# Patient Record
Sex: Male | Born: 1965 | Race: Black or African American | Hispanic: No | Smoking: Current every day smoker
Health system: Southern US, Community
[De-identification: ages and names within clinical notes are randomized; demographics above are authoritative.]

## PROBLEM LIST (undated history)

## (undated) DIAGNOSIS — I10 Essential (primary) hypertension: Secondary | ICD-10-CM

## (undated) DIAGNOSIS — M1990 Unspecified osteoarthritis, unspecified site: Secondary | ICD-10-CM

## (undated) DIAGNOSIS — E119 Type 2 diabetes mellitus without complications: Secondary | ICD-10-CM

---

## 1999-01-15 ENCOUNTER — Encounter: Payer: Self-pay | Admitting: Emergency Medicine

## 1999-01-15 ENCOUNTER — Emergency Department (HOSPITAL_COMMUNITY): Admission: EM | Admit: 1999-01-15 | Discharge: 1999-01-15 | Payer: Self-pay | Admitting: Emergency Medicine

## 1999-01-17 ENCOUNTER — Emergency Department (HOSPITAL_COMMUNITY): Admission: EM | Admit: 1999-01-17 | Discharge: 1999-01-17 | Payer: Self-pay | Admitting: Emergency Medicine

## 2000-08-04 ENCOUNTER — Emergency Department (HOSPITAL_COMMUNITY): Admission: EM | Admit: 2000-08-04 | Discharge: 2000-08-04 | Payer: Self-pay | Admitting: Emergency Medicine

## 2008-03-12 ENCOUNTER — Emergency Department (HOSPITAL_COMMUNITY): Admission: EM | Admit: 2008-03-12 | Discharge: 2008-03-12 | Payer: Self-pay | Admitting: Emergency Medicine

## 2012-01-02 ENCOUNTER — Emergency Department (HOSPITAL_COMMUNITY): Payer: No Typology Code available for payment source

## 2012-01-02 ENCOUNTER — Encounter (HOSPITAL_COMMUNITY): Payer: Self-pay | Admitting: *Deleted

## 2012-01-02 ENCOUNTER — Emergency Department (HOSPITAL_COMMUNITY)
Admission: EM | Admit: 2012-01-02 | Discharge: 2012-01-03 | Disposition: A | Discharge status: Home / Self Care | Payer: No Typology Code available for payment source | Attending: Emergency Medicine | Admitting: Emergency Medicine

## 2012-01-02 DIAGNOSIS — IMO0002 Reserved for concepts with insufficient information to code with codable children: Secondary | ICD-10-CM | POA: Insufficient documentation

## 2012-01-02 DIAGNOSIS — S298XXA Other specified injuries of thorax, initial encounter: Secondary | ICD-10-CM | POA: Insufficient documentation

## 2012-01-02 DIAGNOSIS — S299XXA Unspecified injury of thorax, initial encounter: Secondary | ICD-10-CM

## 2012-01-02 DIAGNOSIS — R079 Chest pain, unspecified: Secondary | ICD-10-CM | POA: Insufficient documentation

## 2012-01-02 DIAGNOSIS — M25559 Pain in unspecified hip: Secondary | ICD-10-CM | POA: Insufficient documentation

## 2012-01-02 MED ORDER — HYDROCODONE-ACETAMINOPHEN 5-500 MG PO TABS: 1.0000 | ORAL_TABLET | Freq: Four times a day (QID) | ORAL | Status: AC | PRN

## 2012-01-02 MED ORDER — OXYCODONE-ACETAMINOPHEN 5-325 MG PO TABS
1.0000 | ORAL_TABLET | Freq: Once | ORAL | Status: AC
  Administered 2012-01-02: 1 via ORAL
  Filled 2012-01-02: qty 1
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
## 2012-01-02 NOTE — ED Provider Notes (Signed)
I saw and evaluated the patient, reviewed the resident's note and I agree with the findings and plan.  Dia Jefferys, MD 01/02/12 2346 
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
## 2012-01-02 NOTE — ED Notes (Signed)
While pt was attempting to dress self, he noted increasing pain to rgt hip with weight bearing; MD made aware of same; orders placed for films and pain meds
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
## 2012-01-05 ENCOUNTER — Emergency Department (INDEPENDENT_AMBULATORY_CARE_PROVIDER_SITE_OTHER)
Admission: EM | Admit: 2012-01-05 | Discharge: 2012-01-05 | Disposition: A | Discharge status: Home / Self Care | Payer: Self-pay | Source: Home / Self Care | Attending: Family Medicine | Admitting: Family Medicine

## 2012-01-05 ENCOUNTER — Encounter (HOSPITAL_COMMUNITY): Payer: Self-pay | Admitting: Emergency Medicine

## 2012-01-05 DIAGNOSIS — S39012D Strain of muscle, fascia and tendon of lower back, subsequent encounter: Secondary | ICD-10-CM

## 2012-01-05 DIAGNOSIS — IMO0002 Reserved for concepts with insufficient information to code with codable children: Secondary | ICD-10-CM

## 2012-01-05 MED ORDER — CHLORZOXAZONE 500 MG PO TABS: 500.0000 mg | ORAL_TABLET | Freq: Four times a day (QID) | ORAL | Status: AC | PRN

## 2012-01-05 MED ORDER — IBUPROFEN 800 MG PO TABS: 800.0000 mg | ORAL_TABLET | Freq: Three times a day (TID) | ORAL | Status: AC
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
## 2012-01-05 NOTE — Discharge Instructions (Signed)
Heat and medicine as needed, activity as tolerated, return if further problems.
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
## 2014-09-18 ENCOUNTER — Emergency Department (INDEPENDENT_AMBULATORY_CARE_PROVIDER_SITE_OTHER): Payer: BLUE CROSS/BLUE SHIELD

## 2014-09-18 ENCOUNTER — Emergency Department (HOSPITAL_COMMUNITY)
Admission: EM | Admit: 2014-09-18 | Discharge: 2014-09-18 | Disposition: A | Discharge status: Home / Self Care | Payer: BLUE CROSS/BLUE SHIELD | Source: Home / Self Care | Attending: Family Medicine | Admitting: Family Medicine

## 2014-09-18 ENCOUNTER — Encounter (HOSPITAL_COMMUNITY): Payer: Self-pay

## 2014-09-18 DIAGNOSIS — M5137 Other intervertebral disc degeneration, lumbosacral region: Secondary | ICD-10-CM | POA: Diagnosis not present

## 2014-09-18 MED ORDER — DICLOFENAC POTASSIUM 50 MG PO TABS: 50.0000 mg | ORAL_TABLET | Freq: Three times a day (TID) | ORAL | Status: DC

## 2014-09-18 MED ORDER — METHYLPREDNISOLONE ACETATE 80 MG/ML IJ SUSP
INTRAMUSCULAR | Status: AC
  Filled 2014-09-18: qty 1

## 2014-09-18 MED ORDER — CYCLOBENZAPRINE HCL 5 MG PO TABS: 5.0000 mg | ORAL_TABLET | Freq: Three times a day (TID) | ORAL | Status: DC | PRN

## 2014-09-18 MED ORDER — METHYLPREDNISOLONE ACETATE PF 80 MG/ML IJ SUSP
80.0000 mg | Freq: Once | INTRAMUSCULAR | Status: AC
  Administered 2014-09-18: 80 mg via INTRAMUSCULAR

## 2014-09-18 NOTE — Discharge Instructions (Signed)
Use medicine and heat to back as needed and see orthopedist if further problems.
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
## 2014-09-18 NOTE — ED Notes (Signed)
Pain off and on x several years. OTC medication give minimal relief. NAD at present

## 2015-05-19 ENCOUNTER — Encounter (HOSPITAL_COMMUNITY): Payer: Self-pay | Admitting: Emergency Medicine

## 2015-05-19 ENCOUNTER — Emergency Department (HOSPITAL_COMMUNITY)
Admission: EM | Admit: 2015-05-19 | Discharge: 2015-05-20 | Disposition: A | Discharge status: Home / Self Care | Payer: BLUE CROSS/BLUE SHIELD | Attending: Emergency Medicine | Admitting: Emergency Medicine

## 2015-05-19 ENCOUNTER — Emergency Department (HOSPITAL_COMMUNITY): Payer: BLUE CROSS/BLUE SHIELD

## 2015-05-19 DIAGNOSIS — Z720 Tobacco use: Secondary | ICD-10-CM | POA: Insufficient documentation

## 2015-05-19 DIAGNOSIS — M25561 Pain in right knee: Secondary | ICD-10-CM | POA: Diagnosis present

## 2015-05-19 DIAGNOSIS — Z791 Long term (current) use of non-steroidal anti-inflammatories (NSAID): Secondary | ICD-10-CM | POA: Diagnosis not present

## 2015-05-19 DIAGNOSIS — M1711 Unilateral primary osteoarthritis, right knee: Secondary | ICD-10-CM | POA: Diagnosis not present

## 2015-05-19 MED ORDER — OXYCODONE-ACETAMINOPHEN 5-325 MG PO TABS
1.0000 | ORAL_TABLET | Freq: Once | ORAL | Status: AC
  Administered 2015-05-20: 1 via ORAL
  Filled 2015-05-19: qty 1

## 2015-05-19 MED ORDER — ONDANSETRON 4 MG PO TBDP
4.0000 mg | ORAL_TABLET | Freq: Once | ORAL | Status: AC
  Administered 2015-05-20: 4 mg via ORAL
  Filled 2015-05-19: qty 1

## 2015-05-19 NOTE — ED Provider Notes (Signed)
CSN: [id number]     Arrival date & time 05/19/15  2229 History   First MD Initiated Contact with Patient 05/19/15 2254     Chief Complaint  Patient presents with  . Knee Pain     (Consider location/radiation/quality/duration/timing/severity/associated sxs/prior Treatment) HPI   Louis Gibson is a 49 y.o. male  PCP: No primary care provider on file.  Blood pressure 167/97, pulse 77, temperature 98.2 F (36.8 C), temperature source Oral, resp. rate 16, height  (1.854 m), weight 275 lb (124.739 kg), SpO2 99 %.  SIGNIFICANT PMH: none CHIEF COMPLAINT: knee pain (right)  When: off and on the last 3 weeks Mechanism: it has been swelling and becoming painful without an illicit ng factors, no fall Chronicity: acute on chronic Location: right knee Radiation: none Quality and severity: severe, sharp,grinding Treatments tried: cane,Ibuprofen, good powders Alleviating factors: resting Worsening factors: walking on it and moving Associated Symptoms: pain Risk Factors: none  Negative ROS: Confusion, diaphoresis, fever, headache, weakness (general or focal), change of vision,  neck pain, dysphagia, aphagia, chest pain, shortness of breath,  back pain, abdominal pains, nausea, vomiting, diarrhea,rash.   History reviewed. No pertinent past medical history. History reviewed. No pertinent past surgical history. No family history on file. Social History  Substance Use Topics  . Smoking status: Current Every Day Smoker  . Smokeless tobacco: None  . Alcohol Use: 2.4 oz/week    4 Shots of liquor per week    Review of Systems    Allergies  Eggs or egg-derived products  Home Medications   Prior to Admission medications   Medication Sig Start Date End Date Taking? Authorizing Provider  cyclobenzaprine (FLEXERIL) 5 MG tablet Take 1 tablet (5 mg total) by mouth 3 (three) times daily as needed for muscle spasms. 09/18/14   Linna Hoff, MD  diclofenac (CATAFLAM) 50 MG tablet Take  1 tablet (50 mg total) by mouth 3 (three) times daily. 05/20/15   Marlon Pel, PA-C  HYDROcodone-acetaminophen (NORCO/VICODIN) 5-325 MG tablet Take 1 tablet by mouth every 6 (six) hours as needed for severe pain. 05/20/15   Vonzella Althaus Neva Seat, PA-C  naproxen sodium (ANAPROX) 220 MG tablet Take 220 mg by mouth daily.    Historical Provider, MD   BP 167/97 mmHg  Pulse 77  Temp(Src) 98.2 F (36.8 C) (Oral)  Resp 16  Ht  (1.854 m)  Wt 275 lb (124.739 kg)  BMI 36.29 kg/m2  SpO2 99% Physical Exam  Constitutional: He appears well-developed and well-nourished. No distress.  HENT:  Head: Normocephalic and atraumatic.  Eyes: Pupils are equal, round, and reactive to light.  Neck: Normal range of motion. Neck supple.  Cardiovascular: Normal rate and regular rhythm.   Pulmonary/Chest: Effort normal.  Abdominal: Soft.  Musculoskeletal:       Right knee: He exhibits decreased range of motion, swelling and effusion. He exhibits no ecchymosis, no deformity, no laceration and no erythema. Tenderness (diffuse) found. No patellar tendon tenderness noted.  No induration, redness or rash.  Neurological: He is alert.  Skin: Skin is warm and dry.  Nursing note and vitals reviewed.   ED Course  Procedures (including critical care time) Labs Review Labs Reviewed - No data to display  Imaging Review Dg Knee Complete 4 Views Right  05/19/2015   CLINICAL DATA:  Right knee pain for several months  EXAM: RIGHT KNEE - COMPLETE 4+ VIEW  COMPARISON:  None.  FINDINGS: There is no evidence of fracture, dislocation. There is minimal suprapatellar  effusion. There are degenerative joint changes with narrowed joint space and osteophyte formation. Soft tissues are unremarkable.  IMPRESSION: Moderate to severe osteoarthritic changes of the right knee. No acute fracture or dislocation noted.   Electronically Signed   By: Sherian Rein M.D.   On: 05/19/2015 23:37   I have personally reviewed and evaluated these images  and lab results as part of my medical decision-making.   EKG Interpretation None      MDM   Final diagnoses:  Arthritis of knee, right    Xray shows significant bone on bone severe osteoarthritic changes to the right knee but no acute changes noted. Patient reports taking "too much" Ibuprofen and it not controlling his pain. Will be given a referral to Ortho and a script for Vicodin for him to use sparingly. He has a cane at home he has been instructed to use. No signs of infection or gout. Normal vital signs, no numbness or weakness, no falls.  Medications  oxyCODONE-acetaminophen (PERCOCET/ROXICET) 5-325 MG per tablet 1 tablet (not administered)  ondansetron (ZOFRAN-ODT) disintegrating tablet 4 mg (not administered)    49 y.o.Louis Tatar's evaluation in the Emergency Department is complete. It has been determined that no acute conditions requiring further emergency intervention are present at this time. The patient/guardian have been advised of the diagnosis and plan. We have discussed signs and symptoms that warrant return to the ED, such as changes or worsening in symptoms.  Vital signs are stable at discharge. Filed Vitals:   05/19/15 2235  BP: 167/97  Pulse: 77  Temp: 98.2 F (36.8 C)  Resp: 16    Patient/guardian has voiced understanding and agreed to follow-up with the PCP or specialist.      Marlon Pel, PA-C 05/20/15 0011  Shon Baton, MD 05/21/15 [phone]

## 2015-05-19 NOTE — ED Notes (Signed)
Pt reports pain and swelling to R knee intermittently. Pt does have arthritis. Denies recent injury

## 2015-05-19 NOTE — Discharge Instructions (Signed)
Arthritis, Nonspecific °Arthritis is inflammation of a joint. This usually means pain, redness, warmth or swelling are present. One or more joints may be involved. There are a number of types of arthritis. Your caregiver may not be able to tell what type of arthritis you have right away. °CAUSES  °The most common cause of arthritis is the wear and tear on the joint (osteoarthritis). This causes damage to the cartilage, which can break down over time. The knees, hips, back and neck are most often affected by this type of arthritis. °Other types of arthritis and common causes of joint pain include: °· Sprains and other injuries near the joint. Sometimes minor sprains and injuries cause pain and swelling that develop hours later. °· Rheumatoid arthritis. This affects hands, feet and knees. It usually affects both sides of your body at the same time. It is often associated with chronic ailments, fever, weight loss and general weakness. °· Crystal arthritis. Gout and pseudo gout can cause occasional acute severe pain, redness and swelling in the foot, ankle, or knee. °· Infectious arthritis. Bacteria can get into a joint through a break in overlying skin. This can cause infection of the joint. Bacteria and viruses can also spread through the blood and affect your joints. °· Drug, infectious and allergy reactions. Sometimes joints can become mildly painful and slightly swollen with these types of illnesses. °SYMPTOMS  °· Pain is the main symptom. °· Your joint or joints can also be red, swollen and warm or hot to the touch. °· You may have a fever with certain types of arthritis, or even feel overall ill. °· The joint with arthritis will hurt with movement. Stiffness is present with some types of arthritis. °DIAGNOSIS  °Your caregiver will suspect arthritis based on your description of your symptoms and on your exam. Testing may be needed to find the type of arthritis: °· Blood and sometimes urine tests. °· X-ray tests  and sometimes CT or MRI scans. °· Removal of fluid from the joint (arthrocentesis) is done to check for bacteria, crystals or other causes. Your caregiver (or a specialist) will numb the area over the joint with a local anesthetic, and use a needle to remove joint fluid for examination. This procedure is only minimally uncomfortable. °· Even with these tests, your caregiver may not be able to tell what kind of arthritis you have. Consultation with a specialist (rheumatologist) may be helpful. °TREATMENT  °Your caregiver will discuss with you treatment specific to your type of arthritis. If the specific type cannot be determined, then the following general recommendations may apply. °Treatment of severe joint pain includes: °· Rest. °· Elevation. °· Anti-inflammatory medication (for example, ibuprofen) may be prescribed. Avoiding activities that cause increased pain. °· Only take over-the-counter or prescription medicines for pain and discomfort as recommended by your caregiver. °· Cold packs over an inflamed joint may be used for 10 to 15 minutes every hour. Hot packs sometimes feel better, but do not use overnight. Do not use hot packs if you are diabetic without your caregiver's permission. °· A cortisone shot into arthritic joints may help reduce pain and swelling. °· Any acute arthritis that gets worse over the next 1 to 2 days needs to be looked at to be sure there is no joint infection. °Long-term arthritis treatment involves modifying activities and lifestyle to reduce joint stress jarring. This can include weight loss. Also, exercise is needed to nourish the joint cartilage and remove waste. This helps keep the muscles   around the joint strong. °HOME CARE INSTRUCTIONS  °· Do not take aspirin to relieve pain if gout is suspected. This elevates uric acid levels. °· Only take over-the-counter or prescription medicines for pain, discomfort or fever as directed by your caregiver. °· Rest the joint as much as  possible. °· If your joint is swollen, keep it elevated. °· Use crutches if the painful joint is in your leg. °· Drinking plenty of fluids may help for certain types of arthritis. °· Follow your caregiver's dietary instructions. °· Try low-impact exercise such as: °¨ Swimming. °¨ Water aerobics. °¨ Biking. °¨ Walking. °· Morning stiffness is often relieved by a warm shower. °· Put your joints through regular range-of-motion. °SEEK MEDICAL CARE IF:  °· You do not feel better in 24 hours or are getting worse. °· You have side effects to medications, or are not getting better with treatment. °SEEK IMMEDIATE MEDICAL CARE IF:  °· You have a fever. °· You develop severe joint pain, swelling or redness. °· Many joints are involved and become painful and swollen. °· There is severe back pain and/or leg weakness. °· You have loss of bowel or bladder control. °Document Released: 09/14/2004 Document Revised: 10/30/2011 Document Reviewed: 09/30/2008 °[employer]® Patient Information ©2015 [employer]. This information is not intended to replace advice given to you by your health care provider. Make sure you discuss any questions you have with your health care provider. ° °

## 2015-05-20 MED ORDER — DICLOFENAC POTASSIUM 50 MG PO TABS: 50.0000 mg | ORAL_TABLET | Freq: Three times a day (TID) | ORAL | Status: DC

## 2015-05-20 MED ORDER — HYDROCODONE-ACETAMINOPHEN 5-325 MG PO TABS: 1.0000 | ORAL_TABLET | Freq: Four times a day (QID) | ORAL | Status: DC | PRN

## 2015-08-10 ENCOUNTER — Emergency Department (HOSPITAL_COMMUNITY)
Admission: EM | Admit: 2015-08-10 | Discharge: 2015-08-10 | Disposition: A | Discharge status: Nursing Home (Includes ICF) | Payer: BLUE CROSS/BLUE SHIELD | Source: Home / Self Care

## 2015-08-10 ENCOUNTER — Observation Stay (HOSPITAL_COMMUNITY)
Admission: EM | Admit: 2015-08-10 | Discharge: 2015-08-11 | Disposition: A | Discharge status: Home / Self Care | Payer: BLUE CROSS/BLUE SHIELD | Attending: Internal Medicine | Admitting: Internal Medicine

## 2015-08-10 ENCOUNTER — Encounter (HOSPITAL_COMMUNITY): Payer: Self-pay

## 2015-08-10 ENCOUNTER — Emergency Department (HOSPITAL_COMMUNITY): Payer: BLUE CROSS/BLUE SHIELD

## 2015-08-10 DIAGNOSIS — R072 Precordial pain: Secondary | ICD-10-CM | POA: Insufficient documentation

## 2015-08-10 DIAGNOSIS — F17210 Nicotine dependence, cigarettes, uncomplicated: Secondary | ICD-10-CM | POA: Insufficient documentation

## 2015-08-10 DIAGNOSIS — I10 Essential (primary) hypertension: Secondary | ICD-10-CM | POA: Diagnosis not present

## 2015-08-10 DIAGNOSIS — Z91012 Allergy to eggs: Secondary | ICD-10-CM | POA: Insufficient documentation

## 2015-08-10 DIAGNOSIS — J9811 Atelectasis: Secondary | ICD-10-CM | POA: Insufficient documentation

## 2015-08-10 DIAGNOSIS — R0789 Other chest pain: Principal | ICD-10-CM | POA: Insufficient documentation

## 2015-08-10 DIAGNOSIS — Z7982 Long term (current) use of aspirin: Secondary | ICD-10-CM | POA: Insufficient documentation

## 2015-08-10 DIAGNOSIS — R918 Other nonspecific abnormal finding of lung field: Secondary | ICD-10-CM | POA: Diagnosis not present

## 2015-08-10 DIAGNOSIS — R079 Chest pain, unspecified: Secondary | ICD-10-CM | POA: Diagnosis present

## 2015-08-10 DIAGNOSIS — Z8249 Family history of ischemic heart disease and other diseases of the circulatory system: Secondary | ICD-10-CM | POA: Insufficient documentation

## 2015-08-10 DIAGNOSIS — Z720 Tobacco use: Secondary | ICD-10-CM | POA: Diagnosis present

## 2015-08-10 DIAGNOSIS — M1990 Unspecified osteoarthritis, unspecified site: Secondary | ICD-10-CM | POA: Insufficient documentation

## 2015-08-10 HISTORY — DX: Essential (primary) hypertension: I10

## 2015-08-10 HISTORY — DX: Unspecified osteoarthritis, unspecified site: M19.90

## 2015-08-10 LAB — CBC
HEMATOCRIT: 46.4 % (ref 39.0–52.0)
HEMOGLOBIN: 16.1 g/dL (ref 13.0–17.0)
MCH: 30.7 pg (ref 26.0–34.0)
MCHC: 34.7 g/dL (ref 30.0–36.0)
MCV: 88.5 fL (ref 78.0–100.0)
Platelets: 173 10*3/uL (ref 150–400)
RBC: 5.24 MIL/uL (ref 4.22–5.81)
RDW: 12.7 % (ref 11.5–15.5)
WBC: 7.4 10*3/uL (ref 4.0–10.5)

## 2015-08-10 LAB — BASIC METABOLIC PANEL
ANION GAP: 9 (ref 5–15)
BUN: 8 mg/dL (ref 6–20)
CALCIUM: 9.4 mg/dL (ref 8.9–10.3)
CHLORIDE: 104 mmol/L (ref 101–111)
CO2: 26 mmol/L (ref 22–32)
Creatinine, Ser: 1.09 mg/dL (ref 0.61–1.24)
GFR calc Af Amer: >60 mL/min (ref >60)
GFR calc non Af Amer: >60 mL/min (ref >60)
GLUCOSE: 92 mg/dL (ref 65–99)
POTASSIUM: 3.9 mmol/L (ref 3.5–5.1)
Sodium: 139 mmol/L (ref 135–145)

## 2015-08-10 LAB — I-STAT TROPONIN, ED: Troponin i, poc: 0 ng/mL (ref 0.00–0.08)

## 2015-08-10 MED ORDER — ASPIRIN 81 MG PO CHEW
CHEWABLE_TABLET | ORAL | Status: AC
  Filled 2015-08-10: qty 4

## 2015-08-10 MED ORDER — ASPIRIN 81 MG PO CHEW
324.0000 mg | CHEWABLE_TABLET | Freq: Once | ORAL | Status: AC
  Administered 2015-08-10: 324 mg via ORAL

## 2015-08-10 MED ORDER — NITROGLYCERIN 0.4 MG SL SUBL
SUBLINGUAL_TABLET | SUBLINGUAL | Status: AC
  Filled 2015-08-10: qty 1

## 2015-08-10 MED ORDER — NITROGLYCERIN 0.4 MG SL SUBL
0.4000 mg | SUBLINGUAL_TABLET | SUBLINGUAL | Status: DC | PRN
  Administered 2015-08-10 (×2): 0.4 mg via SUBLINGUAL

## 2015-08-10 NOTE — ED Notes (Signed)
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 denies other symptoms related to the pain. Denies SOB, recent cough, denies swears, denies nausea, or dizziness. Skin W/D/color good, NAD, states pain waxes and wanes, but never completely goes away . Started amlodipine and coated baby ASA yesterday

## 2015-08-10 NOTE — ED Notes (Signed)
Pain  Scale  Is   5

## 2015-08-10 NOTE — ED Notes (Signed)
Attempted report, no answer

## 2015-08-10 NOTE — ED Notes (Signed)
Pt transfer from UC for right sided chest pain that began at 1630 this afternoon.  Pt is reproduced with palpation and movement.  Pt reports it was a sudden sharp pain that has now changed over to pressure.  Pt denies SOB, n/v/d, dizziness or diaphoresis.  Pt given 2 nitro and 324mg  ASA PTA.

## 2015-08-10 NOTE — ED Notes (Signed)
20  Angio  l  Hand  1  Att  Site  patent

## 2015-08-10 NOTE — ED Provider Notes (Signed)
CSN: [id number]     Arrival date & time 08/10/15  1850 History   First MD Initiated Contact with Patient 08/10/15 1906     Chief Complaint  Patient presents with  . Chest Pain     (Consider location/radiation/quality/duration/timing/severity/associated sxs/prior Treatment) Patient is a 49 y.o. male presenting with chest pain. The history is provided by the patient.  Chest Pain Pain location:  Substernal area Pain quality: pressure   Pain radiates to:  Does not radiate Pain radiates to the back: no   Pain severity:  Moderate Onset quality:  Gradual Duration:  1 day Timing:  Constant Progression:  Improving Chronicity:  New Context: at rest   Context: no movement, not raising an arm, no stress and no trauma   Relieved by:  Nitroglycerin and rest Worsened by:  Exertion Ineffective treatments:  None tried Associated symptoms: shortness of breath   Associated symptoms: no abdominal pain, no AICD problem, no altered mental status, no anorexia, no anxiety, no back pain, no claudication, no cough, no diaphoresis, no dizziness, no dysphagia, no fatigue, no fever, no headache, no heartburn, no lower extremity edema, no nausea, no near-syncope, no numbness, no palpitations, not vomiting and no weakness   Risk factors: high cholesterol, hypertension, male sex and obesity   Risk factors: no coronary artery disease and no prior DVT/PE     Past Medical History  Diagnosis Date  . Hypertension   . Arthritis    History reviewed. No pertinent past surgical history. Family History  Problem Relation Age of Onset  . Hypertension Brother   . CAD Maternal Uncle   . Diabetes Mellitus II Maternal Uncle    Social History  Substance Use Topics  . Smoking status: Current Every Day Smoker  . Smokeless tobacco: None  . Alcohol Use: 2.4 oz/week    4 Shots of liquor per week    Review of Systems  Constitutional: Negative for fever, diaphoresis and fatigue.  HENT: Negative for trouble  swallowing.   Respiratory: Positive for chest tightness and shortness of breath. Negative for cough.   Cardiovascular: Positive for chest pain. Negative for palpitations, claudication and near-syncope.  Gastrointestinal: Negative for heartburn, nausea, vomiting, abdominal pain and anorexia.  Musculoskeletal: Negative for back pain.  Skin: Negative for wound.  Neurological: Negative for dizziness, weakness, numbness and headaches.      Allergies  Eggs or egg-derived products  Home Medications   Prior to Admission medications   Medication Sig Start Date End Date Taking? Authorizing Provider  amLODipine (NORVASC) 5 MG tablet Take 5 mg by mouth daily.   Yes Historical Provider, MD  aspirin EC 81 MG tablet Take 81 mg by mouth daily.   Yes Historical Provider, MD  Fish Oil OIL Take 1 capsule by mouth 2 (two) times daily.   Yes Historical Provider, MD  GARLIC PO Take 1 tablet by mouth 2 (two) times daily.   Yes Historical Provider, MD  meloxicam (MOBIC) 15 MG tablet Take 15 mg by mouth daily as needed for pain.  07/23/15  Yes Historical Provider, MD  Misc Natural Products (GLUCOSAMINE CHONDROITIN COMPLX) TABS Take 1 tablet by mouth 2 (two) times daily.   Yes Historical Provider, MD  cyclobenzaprine (FLEXERIL) 5 MG tablet Take 1 tablet (5 mg total) by mouth 3 (three) times daily as needed for muscle spasms. Patient not taking: Reported on 08/10/2015 09/18/14   Linna HoffJames D Kindl, MD  diclofenac (CATAFLAM) 50 MG tablet Take 1 tablet (50 mg total) by mouth 3 (  three) times daily. Patient not taking: Reported on 08/10/2015 05/20/15   Marlon Pel, PA-C  HYDROcodone-acetaminophen (NORCO/VICODIN) 5-325 MG tablet Take 1 tablet by mouth every 6 (six) hours as needed for severe pain. Patient not taking: Reported on 08/10/2015 05/20/15   Marlon Pel, PA-C   BP 150/86 mmHg  Pulse 88  Temp(Src) 98.7 F (37.1 C) (Oral)  Resp 18  Ht  (1.854 m)  Wt 124.6 kg  BMI 36.25 kg/m2  SpO2 99% Physical Exam   Constitutional: He is oriented to person, place, and time. He appears well-developed and well-nourished.  HENT:  Head: Normocephalic and atraumatic.  Eyes: Conjunctivae are normal. Pupils are equal, round, and reactive to light. Right eye exhibits no discharge. Left eye exhibits no discharge.  Neck: Normal range of motion. Neck supple.  Cardiovascular: Normal rate and regular rhythm.  Exam reveals no gallop and no friction rub.   No murmur heard. Pulmonary/Chest: Effort normal and breath sounds normal. No respiratory distress. He has no wheezes. He has no rales. He exhibits no tenderness, no crepitus, no edema and no retraction.  Abdominal: Soft. Bowel sounds are normal. He exhibits no distension and no mass. There is no tenderness. There is no rebound and no guarding.  Musculoskeletal: Normal range of motion.  Neurological: He is alert and oriented to person, place, and time. No cranial nerve deficit.  Skin: Skin is warm and dry. He is not diaphoretic.    ED Course  Procedures (including critical care time) Labs Review Labs Reviewed  BASIC METABOLIC PANEL  CBC  I-STAT TROPOININ, ED    Imaging Review Dg Chest 2 View  08/10/2015  CLINICAL DATA:  One day history of chest pain. EXAM: CHEST  2 VIEW COMPARISON:  01/02/2012. FINDINGS: The heart is mildly enlarged but stable. There is moderate tortuosity of the thoracic aorta. Low lung volumes with vascular crowding and streaky basilar atelectasis along with mild chronic scarring changes. No acute pulmonary findings. No pleural effusion. IMPRESSION: No acute cardiopulmonary findings. Electronically Signed   By: Rudie Meyer M.D.   On: 08/10/2015 20:36   I have personally reviewed and evaluated these images and lab results as part of my medical decision-making.   EKG Interpretation   Date/Time:  Tuesday August 10 2015 18:55:20 EST Ventricular Rate:  79 PR Interval:  175 QRS Duration: 91 QT Interval:  361 QTC Calculation: 414 R  Axis:   51 Text Interpretation:  Sinus rhythm Borderline T abnormalities, inferior  leads Confirmed by Denton Lank  MD, Caryn Bee ([id number]) on 08/10/2015 8:06:41 PM      MDM   Final diagnoses:  Retrosternal chest pain    49 year old African-American male with past medical history of hypertension, hyperlipidemia presents in setting of chest pain. Patient reports he was a work today when he started having retrosternal chest pressure. He reports it felt like "someone was putting a cinderblock on my chest". Due to pain EMS was called and patient was given nitroglycerin and 324 of aspirin with improvement in pain.  On arrival patient was human in stable and reported significant improvement in symptoms. Patient had EKG which revealed T-wave inversions in leads 3 and aVF. Additionally poor R-wave progression in anterior leads. We have no comparative prior EKG. In setting of patient's story will obtain cardiac workup including troponin and chest x-ray.  Chest x-ray which I personally reviewed reveals no acute cardiopulmonary abnormality. Troponin not significantly elevated. No significant amount is in CBC or BMP. However due to patient's history and EKG  changes we'll admit to medicine at this time for further ACS workup. Patient stable at time of admission to medicine.  Attending has seen and available patient and Dr. Denton Lank is in agreement with plan.    Stacy Gardner, MD 08/10/15 0981  Cathren Laine, MD 08/16/15 [id number]

## 2015-08-10 NOTE — ED Notes (Signed)
Attempted report 

## 2015-08-10 NOTE — ED Provider Notes (Addendum)
CSN: [id number]     Arrival date & time 08/10/15  1738 History   None    Chief Complaint  Patient presents with  . Chest Pain   (Consider location/radiation/quality/duration/timing/severity/associated sxs/prior Treatment) Patient is a 49 y.o. male presenting with chest pain. The history is provided by the patient.  Chest Pain Pain location:  R chest Pain quality: pressure   Pain quality comment:  Heaviness like a brick on chest. 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 Associated symptoms: no abdominal pain, no back pain, no diaphoresis, no fever, no nausea, no orthopnea, no palpitations, no shortness of breath, not vomiting and no weakness   Risk factors: hypertension, male sex and smoking     Past Medical History  Diagnosis Date  . Hypertension    History reviewed. No pertinent past surgical history. History reviewed. No pertinent family history. Social History  Substance Use Topics  . Smoking status: Current Every Day Smoker  . Smokeless tobacco: None  . Alcohol Use: 2.4 oz/week    4 Shots of liquor per week    Review of Systems  Constitutional: Negative.  Negative for fever and diaphoresis.  Respiratory: Negative.  Negative for shortness of breath.   Cardiovascular: Positive for chest pain. Negative for palpitations, orthopnea and leg swelling.  Gastrointestinal: Negative for nausea, vomiting and abdominal pain.  Genitourinary: Negative.   Musculoskeletal: Negative for back pain.  Neurological: Negative for weakness.  All other systems reviewed and are negative.   Allergies  Eggs or egg-derived products  Home Medications   Prior to Admission medications   Medication Sig Start Date End Date Taking? Authorizing Provider  amLODipine (NORVASC) 5 MG tablet Take 5 mg by mouth daily.   Yes  Historical Provider, MD  cyclobenzaprine (FLEXERIL) 5 MG tablet Take 1 tablet (5 mg total) by mouth 3 (three) times daily as needed for muscle spasms. 09/18/14   Linna Hoff, MD  diclofenac (CATAFLAM) 50 MG tablet Take 1 tablet (50 mg total) by mouth 3 (three) times daily. 05/20/15   Marlon Pel, PA-C  HYDROcodone-acetaminophen (NORCO/VICODIN) 5-325 MG tablet Take 1 tablet by mouth every 6 (six) hours as needed for severe pain. 05/20/15   Tiffany Neva Seat, PA-C  naproxen sodium (ANAPROX) 220 MG tablet Take 220 mg by mouth daily.    Historical Provider, MD   Meds Ordered and Administered this Visit   Medications  nitroGLYCERIN (NITROSTAT) SL tablet 0.4 mg (0.4 mg Sublingual Given 08/10/15 1821)  aspirin chewable tablet 324 mg (324 mg Oral Given 08/10/15 1821)    BP 147/104 mmHg  Pulse 82  Temp(Src) 98.8 F (37.1 C) (Oral)  SpO2 99% No data found.   Physical Exam  Constitutional: He is oriented to person, place, and time. He appears well-developed and well-nourished. No distress.  Eyes: Conjunctivae are normal. Pupils are equal, round, and reactive to light.  Neck: Normal range of motion. Neck supple.  Cardiovascular: Normal rate, regular rhythm, normal heart sounds and intact distal pulses.   Pulmonary/Chest: Effort normal and breath sounds normal. No respiratory distress. He exhibits tenderness.  Neurological: He is alert and oriented to person, place, and time.  Skin: Skin is warm and dry.  Nursing note and vitals reviewed.   ED Course  Procedures (including critical care time)  Labs Review Labs Reviewed - No  data to display  Imaging Review No results found.   Visual Acuity Review  Right Eye Distance:   Left Eye Distance:   Bilateral Distance:    Right Eye Near:   Left Eye Near:    Bilateral Near:     ED ECG REPORT   Date: 08/10/2015  Rate: 77  Rhythm: normal sinus rhythm  QRS Axis: normal  Intervals: normal  ST/T Wave abnormalities: normal  Conduction  Disutrbances:nonspecific intraventricular conduction delay  Narrative Interpretation:   Old EKG Reviewed: none available  I have personally reviewed the EKG tracing and agree with the computerized printout as noted.     MDM   1. Chest pain of uncertain etiology    Sent for sudden cp eval at work today, h/o hbp and smoking.sx like a brick weight on chest.    Linna HoffJames D Trapper Meech, MD 08/10/15 Rickey [id number]  Linna HoffJames D Lielle Vandervort, MD 08/10/15 [phone]

## 2015-08-10 NOTE — ED Notes (Signed)
Nasal o2  At  2 l / min  Cardiac  monitor

## 2015-08-10 NOTE — Progress Notes (Signed)
PATIENT ARRIVED TO UNIT 2W FROM E.D. VIA STRETCHER. AMBULATED TO BATHROOM AND BED. TELE APPLIED. VITALS OBTAINED. ASSESSMENT COMPLETED.  PATIENT ORIENTED TO UNIT AND EQUIPMENT. INSTRUCTED TO CALL FOR ASSISTANCE WHEN NEEDED.  SIGNIFICANT OTHER AT BEDSIDE.

## 2015-08-11 ENCOUNTER — Observation Stay (HOSPITAL_BASED_OUTPATIENT_CLINIC_OR_DEPARTMENT_OTHER): Payer: BLUE CROSS/BLUE SHIELD

## 2015-08-11 ENCOUNTER — Encounter (HOSPITAL_COMMUNITY): Payer: Self-pay | Admitting: Internal Medicine

## 2015-08-11 DIAGNOSIS — I10 Essential (primary) hypertension: Secondary | ICD-10-CM | POA: Diagnosis not present

## 2015-08-11 DIAGNOSIS — R071 Chest pain on breathing: Secondary | ICD-10-CM

## 2015-08-11 DIAGNOSIS — R079 Chest pain, unspecified: Secondary | ICD-10-CM | POA: Diagnosis not present

## 2015-08-11 DIAGNOSIS — Z720 Tobacco use: Secondary | ICD-10-CM | POA: Diagnosis not present

## 2015-08-11 DIAGNOSIS — R918 Other nonspecific abnormal finding of lung field: Secondary | ICD-10-CM | POA: Diagnosis not present

## 2015-08-11 DIAGNOSIS — J9811 Atelectasis: Secondary | ICD-10-CM | POA: Diagnosis not present

## 2015-08-11 DIAGNOSIS — R0789 Other chest pain: Secondary | ICD-10-CM | POA: Diagnosis not present

## 2015-08-11 LAB — TROPONIN I
Troponin I: <0.03 ng/mL (ref <0.031)
Troponin I: <0.03 ng/mL (ref <0.031)

## 2015-08-11 LAB — CBC
HCT: 46 % (ref 39.0–52.0)
Hemoglobin: 15.8 g/dL (ref 13.0–17.0)
MCH: 30.7 pg (ref 26.0–34.0)
MCHC: 34.3 g/dL (ref 30.0–36.0)
MCV: 89.3 fL (ref 78.0–100.0)
PLATELETS: 162 10*3/uL (ref 150–400)
RBC: 5.15 MIL/uL (ref 4.22–5.81)
RDW: 12.8 % (ref 11.5–15.5)
WBC: 6.8 10*3/uL (ref 4.0–10.5)

## 2015-08-11 LAB — CREATININE, SERUM
Creatinine, Ser: 0.93 mg/dL (ref 0.61–1.24)
GFR calc Af Amer: >60 mL/min (ref >60)
GFR calc non Af Amer: >60 mL/min (ref >60)

## 2015-08-11 LAB — D-DIMER, QUANTITATIVE (NOT AT ARMC)

## 2015-08-11 MED ORDER — ACETAMINOPHEN 325 MG PO TABS
650.0000 mg | ORAL_TABLET | ORAL | Status: DC | PRN
Start: 2015-08-11 — End: 2015-08-11

## 2015-08-11 MED ORDER — AMLODIPINE BESYLATE 5 MG PO TABS
5.0000 mg | ORAL_TABLET | Freq: Every day | ORAL | Status: DC
  Administered 2015-08-11: 5 mg via ORAL
  Filled 2015-08-11: qty 1

## 2015-08-11 MED ORDER — ENOXAPARIN SODIUM 40 MG/0.4ML ~~LOC~~ SOLN
40.0000 mg | SUBCUTANEOUS | Status: DC
  Administered 2015-08-11: 40 mg via SUBCUTANEOUS
  Filled 2015-08-11: qty 0.4

## 2015-08-11 MED ORDER — ONDANSETRON HCL 4 MG/2ML IJ SOLN: 4.0000 mg | Freq: Four times a day (QID) | INTRAMUSCULAR | Status: DC | PRN

## 2015-08-11 MED ORDER — ASPIRIN EC 325 MG PO TBEC
325.0000 mg | DELAYED_RELEASE_TABLET | Freq: Every day | ORAL | Status: DC
  Administered 2015-08-11: 325 mg via ORAL
  Filled 2015-08-11: qty 1

## 2015-08-11 NOTE — Progress Notes (Signed)
Echocardiogram 2D Echocardiogram has been performed.  Dorothey BasemanReel, Shanon Becvar M 08/11/2015, 11:36 AM

## 2015-08-11 NOTE — Progress Notes (Signed)
Patient given discharge information and education. No questions or concerns at this time.

## 2015-08-11 NOTE — Progress Notes (Addendum)
Patient seen and examined  49 year old male with a history of hypertension, nicotine dependence, presents with substernal chest pain with exertion. Pain relieved with aspirin and nitroglycerin.  Chest x-ray negative, cardiac enzymes negative, d-dimer negative Cardiology consulted 2-D echo ordered and results pending at this time Cardiology to make a decision about stress test versus cardiac cath

## 2015-08-11 NOTE — H&P (Signed)
[facility] History and Physical  Louis Gibson FAO:[id number] DOB: June 16, 1966 DOA: 08/10/2015  Referring physician: Dr.Seymore. PCP: [facility]  Specialists: None.  Chief Complaint: None.  HPI: Louis Gibson is a 49 y.o. male medical history of hypertension recently placed on amlodipine 2 days ago presents to the ER because of chest pain. Patient's chest pain started last evening while at work. Patient does fork lifting operated. Pain is retrosternal nonradiating constant increased on movement of his upper extremities. Denies any associated shortness of breath diaphoresis nausea or abdominal pain. Since the pain is persistent name is was called and patient was given sublingual nitroglycerin following which patient's pain largely subsided. In the ER and EKG was showing nonspecific T-wave changes. Cardiac markers and chest x-ray was unremarkable. Given the characteristics of pain and tobacco abuse and family history of CAD patient has been admitted to rule out ACS.   Review of Systems: As presented in the history of presenting illness, rest negative.  Past Medical History  Diagnosis Date  . Hypertension   . Arthritis    History reviewed. No pertinent past surgical history. Social History:  reports that he has been smoking.  He does not have any smokeless tobacco history on file. He reports that he drinks about 2.4 oz of alcohol per week. He reports that he does not use illicit drugs. Where does patient live at home. Can patient participate in ADLs? Yes.  Allergies  Allergen Reactions  . Eggs Or Egg-Derived Products Nausea And Vomiting    Family History:  Family History  Problem Relation Age of Onset  . Hypertension Brother   . CAD Maternal Uncle   . Diabetes Mellitus II Maternal Uncle       Prior to Admission medications   Medication Sig Start Date End Date Taking? Authorizing Provider  amLODipine (NORVASC) 5 MG tablet Take 5 mg by mouth daily.   Yes  Historical Provider, MD  aspirin EC 81 MG tablet Take 81 mg by mouth daily.   Yes Historical Provider, MD  Fish Oil OIL Take 1 capsule by mouth 2 (two) times daily.   Yes Historical Provider, MD  GARLIC PO Take 1 tablet by mouth 2 (two) times daily.   Yes Historical Provider, MD  meloxicam (MOBIC) 15 MG tablet Take 15 mg by mouth daily as needed for pain.  07/23/15  Yes Historical Provider, MD  Misc Natural Products (GLUCOSAMINE CHONDROITIN COMPLX) TABS Take 1 tablet by mouth 2 (two) times daily.   Yes Historical Provider, MD  cyclobenzaprine (FLEXERIL) 5 MG tablet Take 1 tablet (5 mg total) by mouth 3 (three) times daily as needed for muscle spasms. Patient not taking: Reported on 08/10/2015 09/18/14   Linna Hoff, MD  diclofenac (CATAFLAM) 50 MG tablet Take 1 tablet (50 mg total) by mouth 3 (three) times daily. Patient not taking: Reported on 08/10/2015 05/20/15   Marlon Pel, PA-C  HYDROcodone-acetaminophen (NORCO/VICODIN) 5-325 MG tablet Take 1 tablet by mouth every 6 (six) hours as needed for severe pain. Patient not taking: Reported on 08/10/2015 05/20/15   Marlon Pel, PA-C    Physical Exam: Filed Vitals:   08/10/15 2030 08/10/15 2045 08/10/15 2130 08/10/15 2314  BP: 138/94 127/92 160/95 150/86  Pulse: 63 68 68 88  Temp:    98.7 F (37.1 C)  TempSrc:    Oral  Resp: Height:      Weight:    124.6 kg (274 lb 11.1 oz)  SpO2: 98% 100%  100% 99%     General:  Moderately built and nourished.  Eyes: Anicteric no pallor.  ENT: No discharge from the ears eyes nose or mouth.  Neck: No mass felt. No JVD appreciated.  Cardiovascular: S1-S2 heard.  Respiratory: No rhonchi or crepitations.  Abdomen: Soft nontender bowel sounds present. No guarding or rigidity.  Skin: No rash.  Musculoskeletal: No edema.  Psychiatric: Appears normal.  Neurologic: Alert awake oriented to time place and person. Moves all extremities.  Labs on Admission:  Basic Metabolic  Panel:  Recent Labs Lab 08/10/15 1944  NA 139  K 3.9  CL 104  CO2 26  GLUCOSE 92  BUN 8  CREATININE 1.09  CALCIUM 9.4   Liver Function Tests: No results for input(s): AST, ALT, ALKPHOS, BILITOT, PROT, ALBUMIN in the last 168 hours. No results for input(s): LIPASE, AMYLASE in the last 168 hours. No results for input(s): AMMONIA in the last 168 hours. CBC:  Recent Labs Lab 08/10/15 1944  WBC 7.4  HGB 16.1  HCT 46.4  MCV 88.5  PLT 173   Cardiac Enzymes: No results for input(s): CKTOTAL, CKMB, CKMBINDEX, TROPONINI in the last 168 hours.  BNP (last 3 results) No results for input(s): BNP in the last 8760 hours.  ProBNP (last 3 results) No results for input(s): PROBNP in the last 8760 hours.  CBG: No results for input(s): GLUCAP in the last 168 hours.  Radiological Exams on Admission: Dg Chest 2 View  08/10/2015  CLINICAL DATA:  One day history of chest pain. EXAM: CHEST  2 VIEW COMPARISON:  01/02/2012. FINDINGS: The heart is mildly enlarged but stable. There is moderate tortuosity of the thoracic aorta. Low lung volumes with vascular crowding and streaky basilar atelectasis along with mild chronic scarring changes. No acute pulmonary findings. No pleural effusion. IMPRESSION: No acute cardiopulmonary findings. Electronically Signed   By: Rudie MeyerP.  Gallerani M.D.   On: 08/10/2015 20:36    EKG: Independently reviewed. Normal sinus rhythm with nonspecific T-wave changes.  Assessment/Plan Principal Problem:   Chest pain Active Problems:   Essential hypertension   Tobacco abuse   1. Chest pain - has typical and atypical features. Pain is sometimes reproducible on deep palpation and also has increased pain on moving his upper extremities. Given the risk factors including smoking and hypertension and family history we'll cycle cardiac markers to rule out ACS and I have placed patient on aspirin and when necessary nitroglycerin. Check 2-D echo. Will keep patient nothing by mouth  past for a.m. for possible cardiac procedure. Check urine drug screen. 2. Hypertension - was recently started on amlodipine 2 days ago. Closely follow blood pressure trends. 3. Tobacco abuse - tobacco cessation counseling requested.  Repeat EKG has been ordered.   DVT Prophylaxis Lovenox.  Code Status: Full code.  Family Communication: Discussed with patient.  Disposition Plan: Admit for observation.    Antoneo Ghrist N. [facility] Pager [phone].  If 7PM-7AM, please contact night-coverage [email or web address] Password [email or web address] 08/11/2015, 12:13 AM

## 2015-08-11 NOTE — Discharge Summary (Signed)
Physician Discharge Summary  Travers Goodley MRN: [id number] DOB/AGE: [birth year] 49 y.o.  PCP: [facility]   Admit date: 08/10/2015 Discharge date: 08/11/2015  Discharge Diagnoses:    Principal Problem:   Chest pain Active Problems:   Essential hypertension   Tobacco abuse    Follow-up recommendations Follow-up with PCP in 3-5 days , including all  additional recommended appointments as below Follow-up CBC, CMP in 3-5 days Patient would benefit from outpatient stress test, to be arranged for by PCP     Medication List    TAKE these medications        amLODipine 5 MG tablet  Commonly known as:  NORVASC  Take 5 mg by mouth daily.     aspirin EC 81 MG tablet  Take 81 mg by mouth daily.     cyclobenzaprine 5 MG tablet  Commonly known as:  FLEXERIL  Take 1 tablet (5 mg total) by mouth 3 (three) times daily as needed for muscle spasms.     diclofenac 50 MG tablet  Commonly known as:  CATAFLAM  Take 1 tablet (50 mg total) by mouth 3 (three) times daily.     Fish Oil Oil  Take 1 capsule by mouth 2 (two) times daily.     GARLIC PO  Take 1 tablet by mouth 2 (two) times daily.     GLUCOSAMINE CHONDROITIN COMPLX Tabs  Take 1 tablet by mouth 2 (two) times daily.     HYDROcodone-acetaminophen 5-325 MG tablet  Commonly known as:  NORCO/VICODIN  Take 1 tablet by mouth every 6 (six) hours as needed for severe pain.     meloxicam 15 MG tablet  Commonly known as:  MOBIC  Take 15 mg by mouth daily as needed for pain.         Discharge Condition: *Stable   Discharge Instructions     Allergies  Allergen Reactions  . Eggs Or Egg-Derived Products Nausea And Vomiting      Disposition: 04-Intermediate Care Facility   Consults:  Cardiology     Significant Diagnostic Studies:  Dg Chest 2 View  08/10/2015  CLINICAL DATA:  One day history of chest pain. EXAM: CHEST  2 VIEW COMPARISON:  01/02/2012. FINDINGS: The heart is mildly enlarged but  stable. There is moderate tortuosity of the thoracic aorta. Low lung volumes with vascular crowding and streaky basilar atelectasis along with mild chronic scarring changes. No acute pulmonary findings. No pleural effusion. IMPRESSION: No acute cardiopulmonary findings. Electronically Signed   By: Marijo Sanes M.D.   On: 08/10/2015 20:36      2-D echo, results pending at this time   [facility] Weights   08/10/15 1856 08/10/15 2314  Weight: 127.007 kg (280 lb) 124.6 kg (274 lb 11.1 oz)     Microbiology: No results found for this or any previous visit (from the past 240 hour(s)).     Blood Culture No results found for: [place], [place], [place], REPTSTATUS    Labs: Results for orders placed or performed during the hospital encounter of 08/10/15 (from the past 48 hour(s))  I-stat troponin, ED (not at [facility], [facility])     Status: None   Collection Time: 08/10/15  7:41 PM  Result Value Ref Range   Troponin i, poc 0.00 0.00 - 0.08 ng/mL   Comment 3            Comment: Due to the release kinetics of cTnI, a negative result within the first hours of the onset of symptoms does  not rule out myocardial infarction with certainty. If myocardial infarction is still suspected, repeat the test at appropriate intervals.   Basic metabolic panel     Status: None   Collection Time: 08/10/15  7:44 PM  Result Value Ref Range   Sodium 139 135 - 145 mmol/L   Potassium 3.9 3.5 - 5.1 mmol/L   Chloride 104 101 - 111 mmol/L   CO2 26 22 - 32 mmol/L   Glucose, Bld 92 65 - 99 mg/dL   BUN 8 6 - 20 mg/dL   Creatinine, Ser 1.09 0.61 - 1.24 mg/dL   Calcium 9.4 8.9 - 10.3 mg/dL   GFR calc non Af Amer >60 >60 mL/min   GFR calc Af Amer >60 >60 mL/min    Comment: (NOTE) The eGFR has been calculated using the CKD EPI equation. This calculation has not been validated in all clinical situations. eGFR's persistently <60 mL/min signify possible Chronic Kidney Disease.    Anion gap 9 5 - 15  CBC     Status: None    Collection Time: 08/10/15  7:44 PM  Result Value Ref Range   WBC 7.4 4.0 - 10.5 K/uL   RBC 5.24 4.22 - 5.81 MIL/uL   Hemoglobin 16.1 13.0 - 17.0 g/dL   HCT 46.4 39.0 - 52.0 %   MCV 88.5 78.0 - 100.0 fL   MCH 30.7 26.0 - 34.0 pg   MCHC 34.7 30.0 - 36.0 g/dL   RDW 12.7 11.5 - 15.5 %   Platelets 173 150 - 400 K/uL  Troponin I (q 6hr x 3)     Status: None   Collection Time: 08/11/15  1:35 AM  Result Value Ref Range   Troponin I <0.03 <0.031 ng/mL    Comment:        NO INDICATION OF MYOCARDIAL INJURY.   CBC     Status: None   Collection Time: 08/11/15  1:35 AM  Result Value Ref Range   WBC 6.8 4.0 - 10.5 K/uL   RBC 5.15 4.22 - 5.81 MIL/uL   Hemoglobin 15.8 13.0 - 17.0 g/dL   HCT 46.0 39.0 - 52.0 %   MCV 89.3 78.0 - 100.0 fL   MCH 30.7 26.0 - 34.0 pg   MCHC 34.3 30.0 - 36.0 g/dL   RDW 12.8 11.5 - 15.5 %   Platelets 162 150 - 400 K/uL  Creatinine, serum     Status: None   Collection Time: 08/11/15  1:35 AM  Result Value Ref Range   Creatinine, Ser 0.93 0.61 - 1.24 mg/dL   GFR calc non Af Amer >60 >60 mL/min   GFR calc Af Amer >60 >60 mL/min    Comment: (NOTE) The eGFR has been calculated using the CKD EPI equation. This calculation has not been validated in all clinical situations. eGFR's persistently <60 mL/min signify possible Chronic Kidney Disease.   D-dimer, quantitative (not at [facility])     Status: None   Collection Time: 08/11/15  1:35 AM  Result Value Ref Range   D-Dimer, Quant <0.27 0.00 - 0.50 ug/mL-FEU    Comment: (NOTE) At the manufacturer cut-off of 0.50 ug/mL FEU, this assay has been documented to exclude PE with a sensitivity and negative predictive value of 97 to 99%.  At this time, this assay has not been approved by the FDA to exclude DVT/VTE. Results should be correlated with clinical presentation.   Troponin I (q 6hr x 3)     Status: None   Collection Time:  08/11/15  6:58 AM  Result Value Ref Range   Troponin I <0.03 <0.031 ng/mL    Comment:         NO INDICATION OF MYOCARDIAL INJURY.   Troponin I (q 6hr x 3)     Status: None   Collection Time: 08/11/15 11:50 AM  Result Value Ref Range   Troponin I <0.03 <0.031 ng/mL    Comment:        NO INDICATION OF MYOCARDIAL INJURY.      Lipid Panel  No results found for: CHOL, TRIG, HDL, CHOLHDL, VLDL, LDLCALC, LDLDIRECT   No results found for: HGBA1C   Lab Results  Component Value Date   CREATININE 0.93 08/11/2015     HPI :49 y.o. male with past medical history of HTN and tobacco abuse who presented to [facility] ED on 08/10/2015 for evaluation of chest pain.  Patient reports being at work when he developed a sternal chest pressure, rating it as an 8/10 at its worse. He went to the Urgent Care for evaluation initially and was referred to [facility] for further evaluation. He reports the pain was an 8/10 for over two hours, worse with positional changes such as twisting from side-to-side. He was given ASA and NTG while in the ED and reported some improvement in his pain, down to a 4/10.   Overnight, he reports the pain gradually subsided and denies any current pain at this time. He denies any associated dyspnea at rest or with exertion, diaphoresis, nausea, or vomiting. He denies any episodes of chest pain prior to what he experienced yesterday.   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 Reports smoking 1-2 cigars per day and occasional alcohol use. Family history significant for CAD in his maternal uncle and CKD in his brother  HOSPITAL COURSE: *  1. Chest Pain with Typical and Atypical Features - chest pain lasting 10+ hours. Worse with positional changes. Slightly relieved with administration of SL NTG. D-dimer negative, cardiac enzymes negative 2-D echo results pending at this time. If normal, would likely not require further inpatient workup. Could consider outpatient nuclear stress testing.  2. HTN - continue current medication regimen  3.  Tobacco Abuse - smoking cessation advised    Discharge Exam:  Blood pressure 128/87, pulse 63, temperature 98.5 F (36.9 C), temperature source Oral, resp. rate 18, height '6\' 1"'  (1.854 m), weight 124.6 kg (274 lb 11.1 oz), SpO2 100 %.  General: Pleasant, African American male in NAD Psych: Normal affect. Neuro: Alert and oriented X 3. Moves all extremities spontaneously. HEENT: Normal Neck: Supple without bruits or JVD. Lungs: Resp regular and unlabored, CTA without wheezing or rales. Heart: RRR no s3, s4, or murmurs. Abdomen: Soft, non-tender, non-distended, BS + x 4.  Extremities: No clubbing, cyanosis or edema. DP/PT/Radials 2+ and equal bilaterally        Follow-up Information    Follow up with [facility]. Schedule an appointment as soon as possible for a visit in 3 days.   Specialty:  Internal Medicine   Contact information:   [address] [place] [address] [phone]       Signed: Reyne Dumas 08/11/2015, 2:33 PM        Time spent >45 mins

## 2015-08-11 NOTE — Consult Note (Signed)
CARDIOLOGY CONSULT NOTE   Patient ID: Louis Gibson MRN: [id number], DOB/AGE: 51/01/1966   Admit date: 08/10/2015 Date of Consult: 08/11/2015 Reason for Consult: Chest Pain   Primary Physician: [facility] Primary Cardiologist: New  HPI: Louis SkiffRoosevelt Masso is a 49 y.o. male with past medical history of HTN and tobacco abuse who [facility] for evaluation of chest pain.  Patient reports being at work when he developed a sternal chest pressure, rating it as an 8/10 at its worse. He went to the Urgent Care for evaluation initially and was referred to [facility] for further evaluation. He reports the pain was an 8/10 for over two hours, worse with positional changes such as twisting from side-to-side. He was given ASA and NTG while in the ED and reported some improvement in his pain, down to a 4/10.   Overnight, he reports the pain gradually subsided and denies any current pain at this time. He denies any associated dyspnea at rest or with exertion, diaphoresis, nausea, or vomiting. He denies any episodes of chest pain prior to what he experienced yesterday.   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 Reports smoking 1-2 cigars per day and occasional alcohol use. Family history significant for CAD in his maternal uncle and CKD in his brother.    Problem List Past Medical History  Diagnosis Date  . Hypertension   . Arthritis     History reviewed. No pertinent past surgical history. - No Past Surgical History.  Allergies Allergies  Allergen Reactions  . Eggs Or Egg-Derived Products Nausea And Vomiting   Inpatient Medications . amLODipine  5 mg Oral Daily  . aspirin EC  325 mg Oral Daily  . enoxaparin (LOVENOX) injection  40 mg Subcutaneous Q24H    Family History Family History  Problem Relation Age of Onset  . Hypertension Brother   . CAD Maternal Uncle   . Diabetes Mellitus II Maternal Uncle     Social  History Social History   Social History  . Marital Status: Divorced    Spouse Name: N/A  . Number of Children: N/A  . Years of Education: N/A   Occupational History  . Not on file.   Social History Main Topics  . Smoking status: Current Every Day Smoker  . Smokeless tobacco: Not on file  . Alcohol Use: 2.4 oz/week    4 Shots of liquor per week  . Drug Use: No  . Sexual Activity: Not on file   Other Topics Concern  . Not on file   Social History Narrative     Review of Systems General:  No chills, fever, night sweats or weight changes.  Cardiovascular:  No dyspnea on exertion, edema, orthopnea, palpitations, paroxysmal nocturnal dyspnea. Positive for chest pain, worse with positional changes. Dermatological: No rash, lesions/masses Respiratory: No cough, dyspnea Urologic: No hematuria, dysuria Abdominal:   No nausea, vomiting, diarrhea, bright red blood per rectum, melena, or hematemesis Neurologic:  No visual changes, wkns, changes in mental status. All other systems reviewed and are otherwise negative except as noted above.  Physical Exam  Blood pressure 128/87, pulse 63, temperature 98.5 F (36.9 C), temperature source Oral, resp. rate 18, height 6\' 1"  (1.854 m), weight 274 lb 11.1 oz (124.6 kg), SpO2 100 %.  General: Pleasant, African American male in NAD Psych: Normal affect. Neuro: Alert and oriented X 3. Moves all extremities spontaneously. HEENT: Normal  Neck: Supple without bruits or  JVD. Lungs:  Resp regular and unlabored, CTA without wheezing or rales. Heart: RRR no s3, s4, or murmurs. Abdomen: Soft, non-tender, non-distended, BS + x 4.  Extremities: No clubbing, cyanosis or edema. DP/PT/Radials 2+ and equal bilaterally.  Labs  Recent Labs  08/11/15 0135 08/11/15 0658 08/11/15 1150  TROPONINI <0.03 <0.03 <0.03   Lab Results  Component Value Date   WBC 6.8 08/11/2015   HGB 15.8 08/11/2015   HCT 46.0 08/11/2015   MCV 89.3 08/11/2015   PLT 162  08/11/2015     Recent Labs Lab 08/10/15 1944 08/11/15 0135  NA 139  --   K 3.9  --   CL 104  --   CO2 26  --   BUN 8  --   CREATININE 1.09 0.93  CALCIUM 9.4  --   GLUCOSE 92  --    No results found for: CHOL, HDL, LDLCALC, TRIG Lab Results  Component Value Date   DDIMER <0.27 08/11/2015    Radiology/Studies Dg Chest 2 View: 08/10/2015  CLINICAL DATA:  One day history of chest pain. EXAM: CHEST  2 VIEW COMPARISON:  01/02/2012. FINDINGS: The heart is mildly enlarged but stable. There is moderate tortuosity of the thoracic aorta. Low lung volumes with vascular crowding and streaky basilar atelectasis along with mild chronic scarring changes. No acute pulmonary findings. No pleural effusion. IMPRESSION: No acute cardiopulmonary findings. Electronically Signed   By: Rudie Meyer M.D.   On: 08/10/2015 20:36    ECG: NSR, HR in 60's. TWI in Lead III.  ECHOCARDIOGRAM: Pending   ASSESSMENT AND PLAN  1. Chest Pain with Typical and Atypical Features - chest pain lasting 10+ hours. Worse with positional changes. Slightly relieved with administration of SL NTG.  - cyclic troponin values have been negative. EKG shows TWI in Lead III. - Echo has been performed, results pending. If normal, would likely not require further inpatient workup. Could consider outpatient nuclear stress testing.  2. HTN - continue current medication regimen  3. Tobacco Abuse - smoking cessation advised  Signed, Ellsworth Lennox, PA-C 08/11/2015, 12:38 PM Pager: [phone] Patient seen and examined and history reviewed. Agree with above findings and plan. 49 yo BM presented with anterior chest pain lasting about 2 hours. Worse with twisting or raising his right arm. Pain resolved. Exam is normal. Ecg is normal. All blood work is normal. Echo is pending. I think this is most likely musculoskeletal pain. If Echo is OK would DC home today. If pain recurs we could schedule for outpatient stress test.    Peter Sw[place], MDFACC 08/11/2015 1:18 PM

## 2016-01-13 ENCOUNTER — Other Ambulatory Visit: Payer: Self-pay | Admitting: Orthopedic Surgery

## 2016-01-13 DIAGNOSIS — M545 Low back pain, unspecified: Secondary | ICD-10-CM

## 2016-01-13 DIAGNOSIS — G8929 Other chronic pain: Secondary | ICD-10-CM

## 2016-01-22 ENCOUNTER — Other Ambulatory Visit: Payer: Self-pay

## 2016-01-23 ENCOUNTER — Ambulatory Visit
Admission: RE | Admit: 2016-01-23 | Discharge: 2016-01-23 | Disposition: A | Discharge status: Home / Self Care | Payer: BLUE CROSS/BLUE SHIELD | Source: Ambulatory Visit | Attending: Orthopedic Surgery | Admitting: Orthopedic Surgery

## 2016-01-23 DIAGNOSIS — M545 Low back pain: Principal | ICD-10-CM

## 2016-01-23 DIAGNOSIS — G8929 Other chronic pain: Secondary | ICD-10-CM

## 2016-06-26 ENCOUNTER — Emergency Department (HOSPITAL_COMMUNITY)
Admission: EM | Admit: 2016-06-26 | Discharge: 2016-06-27 | Disposition: A | Discharge status: Home / Self Care | Payer: BLUE CROSS/BLUE SHIELD | Attending: Emergency Medicine | Admitting: Emergency Medicine

## 2016-06-26 ENCOUNTER — Encounter (HOSPITAL_COMMUNITY): Payer: Self-pay | Admitting: Family Medicine

## 2016-06-26 DIAGNOSIS — Y929 Unspecified place or not applicable: Secondary | ICD-10-CM | POA: Insufficient documentation

## 2016-06-26 DIAGNOSIS — S39012A Strain of muscle, fascia and tendon of lower back, initial encounter: Secondary | ICD-10-CM | POA: Diagnosis not present

## 2016-06-26 DIAGNOSIS — Z7982 Long term (current) use of aspirin: Secondary | ICD-10-CM | POA: Diagnosis not present

## 2016-06-26 DIAGNOSIS — Y999 Unspecified external cause status: Secondary | ICD-10-CM | POA: Diagnosis not present

## 2016-06-26 DIAGNOSIS — I10 Essential (primary) hypertension: Secondary | ICD-10-CM | POA: Insufficient documentation

## 2016-06-26 DIAGNOSIS — Y939 Activity, unspecified: Secondary | ICD-10-CM | POA: Diagnosis not present

## 2016-06-26 DIAGNOSIS — X501XXA Overexertion from prolonged static or awkward postures, initial encounter: Secondary | ICD-10-CM | POA: Diagnosis not present

## 2016-06-26 DIAGNOSIS — F17290 Nicotine dependence, other tobacco product, uncomplicated: Secondary | ICD-10-CM | POA: Diagnosis not present

## 2016-06-26 DIAGNOSIS — S3992XA Unspecified injury of lower back, initial encounter: Secondary | ICD-10-CM | POA: Diagnosis present

## 2016-06-26 MED ORDER — DEXAMETHASONE SODIUM PHOSPHATE 10 MG/ML IJ SOLN
10.0000 mg | Freq: Once | INTRAMUSCULAR | Status: AC
  Administered 2016-06-27: 10 mg via INTRAMUSCULAR
  Filled 2016-06-26: qty 1

## 2016-06-26 MED ORDER — KETOROLAC TROMETHAMINE 60 MG/2ML IM SOLN
60.0000 mg | Freq: Once | INTRAMUSCULAR | Status: AC
  Administered 2016-06-27: 60 mg via INTRAMUSCULAR
  Filled 2016-06-26: qty 2

## 2016-06-26 NOTE — ED Triage Notes (Signed)
Patient is from home and [facility]. Patient is complaining of left lower back pain that radiates to his left lower abd. Pt took a PERCOCET 1 tablet at 18:30.

## 2016-06-27 MED ORDER — NAPROXEN 500 MG PO TABS: 500.0000 mg | ORAL_TABLET | Freq: Two times a day (BID) | ORAL | 0 refills | Status: DC

## 2016-06-27 MED ORDER — HYDROCODONE-ACETAMINOPHEN 5-325 MG PO TABS: 1.0000 | ORAL_TABLET | Freq: Four times a day (QID) | ORAL | 0 refills | Status: DC | PRN

## 2016-06-27 MED ORDER — CYCLOBENZAPRINE HCL 10 MG PO TABS: 10.0000 mg | ORAL_TABLET | Freq: Two times a day (BID) | ORAL | 0 refills | Status: DC | PRN

## 2016-06-27 NOTE — Discharge Instructions (Signed)
I recommend Lidocaine 4% patches ( brands: Salon Pas or Icy hot)  SEEK IMMEDIATE MEDICAL ATTENTION IF: New numbness, tingling, weakness, or problem with the use of your arms or legs.  Severe back pain not relieved with medications.  Change in bowel or bladder control.  Increasing pain in any areas of the body (such as chest or abdominal pain).  Shortness of breath, dizziness or fainting.  Nausea (feeling sick to your stomach), vomiting, fever, or swea

## 2016-06-27 NOTE — ED Provider Notes (Signed)
[facility]-EMERGENCY DEPT Provider Note   CSN: [id number] Arrival date & time: 06/26/16  2140     History   Chief Complaint Chief Complaint  Patient presents with  . Back Pain    HPI  Louis Gibson is a 50 y.o. male who complains of an injury causing low back pain 3 hour(s) ago. The pain is positional with bending or lifting, without radiation down the legs. Mechanism of injury: Patient was twisting and reaching for the remote control when he had sudden onset acute low back spasm. Symptoms have been constant since that time. Prior history of back problems: recurrent self limited episodes of low back pain in the past and previous herniated disc. There is no numbness in the legs. Denies saddle anesthesia, weakness, numbness, bowel or bladder incontinence.    Back Pain   This is a new problem. The current episode started 3 to 5 hours ago. The problem occurs constantly. The problem has not changed since onset.The pain is associated with twisting. The pain is present in the lumbar spine. The quality of the pain is described as stabbing. The pain does not radiate. The pain is at a severity of 10/10. The pain is severe. The symptoms are aggravated by bending, twisting and certain positions. Pertinent negatives include no chest pain, no fever, no numbness, no weight loss, no headaches, no abdominal pain, no abdominal swelling, no bowel incontinence, no perianal numbness, no bladder incontinence, no dysuria, no pelvic pain, no leg pain, no paresthesias, no paresis, no tingling and no weakness. He has tried nothing for the symptoms. Risk factors include obesity, lack of exercise and poor posture.    Past Medical History:  Diagnosis Date  . Arthritis   . Hypertension     Patient Active Problem List   Diagnosis Date Noted  . Chest pain 08/11/2015  . Essential hypertension 08/11/2015  . Tobacco abuse 08/11/2015  . Retrosternal chest pain 08/10/2015    History reviewed. No pertinent surgical  history.     Home Medications    Prior to Admission medications   Medication Sig Start Date End Date Taking? Authorizing Provider  amLODipine (NORVASC) 5 MG tablet Take 5 mg by mouth daily.    Historical Provider, MD  aspirin EC 81 MG tablet Take 81 mg by mouth daily.    Historical Provider, MD  cyclobenzaprine (FLEXERIL) 10 MG tablet Take 1 tablet (10 mg total) by mouth 2 (two) times daily as needed for muscle spasms. 06/27/16   Arthor Captain, PA-C  diclofenac (CATAFLAM) 50 MG tablet Take 1 tablet (50 mg total) by mouth 3 (three) times daily. Patient not taking: Reported on 08/10/2015 05/20/15   Marlon Pel, PA-C  Fish Oil OIL Take 1 capsule by mouth 2 (two) times daily.    Historical Provider, MD  GARLIC PO Take 1 tablet by mouth 2 (two) times daily.    Historical Provider, MD  HYDROcodone-acetaminophen (NORCO) 5-325 MG tablet Take 1-2 tablets by mouth every 6 (six) hours as needed for moderate pain. 06/27/16   Arthor Captain, PA-C  meloxicam (MOBIC) 15 MG tablet Take 15 mg by mouth daily as needed for pain.  07/23/15   Historical Provider, MD  Misc Natural Products (GLUCOSAMINE CHONDROITIN COMPLX) TABS Take 1 tablet by mouth 2 (two) times daily.    Historical Provider, MD  naproxen (NAPROSYN) 500 MG tablet Take 1 tablet (500 mg total) by mouth 2 (two) times daily with a meal. 06/27/16   Arthor Captain, PA-C    Family History  Family History  Problem Relation Age of Onset  . Hypertension Brother   . CAD Maternal Uncle   . Diabetes Mellitus II Maternal Uncle   . Kidney disease Brother     Social History Social History  Substance Use Topics  . Smoking status: Current Every Day Smoker    Types: Cigars  . Smokeless tobacco: Never Used     Comment: 1 -2 cigars a day.  . Alcohol use Yes     Comment: 2 - 4 beers on the weekend.     Allergies   Eggs or egg-derived products   Review of Systems Review of Systems  Constitutional: Negative for fever and weight loss.    Cardiovascular: Negative for chest pain.  Gastrointestinal: Negative for abdominal pain and bowel incontinence.  Genitourinary: Negative for bladder incontinence, dysuria and pelvic pain.  Musculoskeletal: Positive for back pain.  Neurological: Negative for tingling, weakness, numbness, headaches and paresthesias.     Physical Exam Updated Vital Signs BP 137/92 (BP Location: Left Arm)   Pulse 80   Temp 98.7 F (37.1 C) (Oral)   Resp 20   Ht 6' (1.829 m)   Wt 131.5 kg   SpO2 98%   BMI 39.33 kg/m   Physical Exam  Physical Exam  Nursing note and vitals reviewed. Constitutional: He appears well-developed and well-nourished. No distress.  HENT:  Head: Normocephalic and atraumatic.  Eyes: Conjunctivae normal are normal. No scleral icterus.  Neck: Normal range of motion. Neck supple.  Cardiovascular: Normal rate, regular rhythm and normal heart sounds.   Pulmonary/Chest: Effort normal and breath sounds normal. No respiratory distress.  Abdominal: Soft. There is no tenderness.  Musculoskeletal: He exhibits no edema.  Patient appears to be in mild to moderate pain, antalgic gait noted. Lumbosacral spine area reveals no local tenderness or mass. Painful and reduced LS ROM noted. Straight leg raise is negative. DTR's, motor strength and sensation normal, including heel and toe gait.  Peripheral pulses are palpable. Neurological: He is alert.  Skin: Skin is warm and dry. He is not diaphoretic.  Psychiatric: His behavior is normal.    ED Treatments / Results  Labs (all labs ordered are listed, but only abnormal results are displayed) Labs Reviewed - No data to display  EKG  EKG Interpretation None       Radiology No results found.  Procedures Procedures (including critical care time)  Medications Ordered in ED Medications  ketorolac (TORADOL) injection 60 mg (60 mg Intramuscular Given 06/27/16 0016)  dexamethasone (DECADRON) injection 10 mg (10 mg Intramuscular Given  06/27/16 0016)     Initial Impression / Assessment and Plan / ED Course  I have reviewed the triage vital signs and the nursing notes.  Pertinent labs & imaging results that were available during my care of the patient were reviewed by me and considered in my medical decision making (see chart for details).  Clinical Course    Patient with back pain.  No neurological deficits and normal neuro exam.  Patient can walk but states is painful.  No loss of bowel or bladder control.  No concern for cauda equina.  No fever, night sweats, weight loss, h/o cancer, IVDU.  RICE protocol and pain medicine indicated and discussed with patient.   Final Clinical Impressions(s) / ED Diagnoses   Final diagnoses:  Strain of lumbar region, initial encounter    New Prescriptions New Prescriptions   CYCLOBENZAPRINE (FLEXERIL) 10 MG TABLET    Take 1 tablet (10 mg total)  by mouth 2 (two) times daily as needed for muscle spasms.   HYDROCODONE-ACETAMINOPHEN (NORCO) 5-325 MG TABLET    Take 1-2 tablets by mouth every 6 (six) hours as needed for moderate pain.   NAPROXEN (NAPROSYN) 500 MG TABLET    Take 1 tablet (500 mg total) by mouth 2 (two) times daily with a meal.     Arthor CaptainAbigail Himani Corona, PA-C 06/27/16 0030    Pricilla LovelessScott Goldston, MD 06/28/16 1719

## 2018-08-29 ENCOUNTER — Other Ambulatory Visit: Payer: Self-pay

## 2018-08-29 ENCOUNTER — Ambulatory Visit (HOSPITAL_COMMUNITY)
Admission: EM | Admit: 2018-08-29 | Discharge: 2018-08-29 | Disposition: A | Discharge status: Home / Self Care | Payer: BLUE CROSS/BLUE SHIELD | Attending: Family Medicine | Admitting: Family Medicine

## 2018-08-29 ENCOUNTER — Encounter (HOSPITAL_COMMUNITY): Payer: Self-pay | Admitting: Emergency Medicine

## 2018-08-29 DIAGNOSIS — M25561 Pain in right knee: Secondary | ICD-10-CM | POA: Diagnosis not present

## 2018-08-29 MED ORDER — DICLOFENAC SODIUM 75 MG PO TBEC: 75.0000 mg | DELAYED_RELEASE_TABLET | Freq: Two times a day (BID) | ORAL | 0 refills | Status: DC

## 2018-08-29 NOTE — ED Triage Notes (Signed)
PT reports his right knee popped while walking 2 days ago.

## 2018-08-29 NOTE — ED Provider Notes (Signed)
[facility]   [id number] 08/29/18 Arrival Time: 1223  ASSESSMENT & PLAN:  1. Acute pain of right knee   Non-traumatic. No indication for plain film imaging at this time. Discussed.  Meds ordered this encounter  Medications  . diclofenac (VOLTAREN) 75 MG EC tablet    Sig: Take 1 tablet (75 mg total) by mouth 2 (two) times daily.    Dispense:  14 tablet    Refill:  0   Orders Placed This Encounter  Procedures  . Apply knee sleeve   Follow-up Information    Pa, [facility].   Specialty:  Internal Medicine Why:  As needed. Contact information: [address] [place] [address] [phone]          He also has an orthopaedist he may f/u with if not improving. Work note given for today and tomorrow.  Reviewed expectations re: course of current medical issues. Questions answered. Outlined signs and symptoms indicating need for more acute intervention. Patient verbalized understanding. After Visit Summary given.  SUBJECTIVE: History from: patient. Louis Gibson is a 53 y.o. male who reports intermittent mild to moderate pain of his right knee; medial mostly but difficult to tell; similar pain in the past; has had knee injections before; described as aching (sharp exacerbation at times); without radiation. Onset: noticed two days ago; reports walking at work and feeling his knee "pop" with immediate discomfort; no trauma No specific worsening of pain since beginning. Aggravating factors: certain movements and prolonged standing. Alleviating factors: rest. Associated symptoms: none reported. Extremity sensation changes or weakness: none. Self treatment: Tylenol with mild help.  History reviewed. No pertinent surgical history.   ROS: As per HPI.   OBJECTIVE:  Vitals:   08/29/18 1249  BP: (!) 154/83  Pulse: 78  Resp: 16  Temp: 98.2 F (36.8 C)  TempSrc: Oral  SpO2: 94%    General appearance: alert; no distress Back: FROM at waist  without difficulty Extremities: . RLE: warm and well perfused; poorly localized moderate tenderness over right medial knee; without gross deformities; with no swelling; with no bruising; ROM: normal but more pain with knee extension both actively and passively; no obvious instability noted CV: brisk extremity capillary refill of RLE; 2+ DP and PT pulse of RLE. Skin: warm and dry; no visible rashes Neurologic: gait normal but favors RLE; normal reflexes of RLE and LLE; normal sensation of RLE and LLE; normal strength of RLE and LLE Psychological: alert and cooperative; normal mood and affect  Allergies  Allergen Reactions  . Eggs Or Egg-Derived Products Nausea And Vomiting    Past Medical History:  Diagnosis Date  . Arthritis   . Hypertension    Social History   Socioeconomic History  . Marital status: Divorced    Spouse name: Not on file  . Number of children: Not on file  . Years of education: Not on file  . Highest education level: Not on file  Occupational History  . Not on file  Social Needs  . Financial resource strain: Not on file  . Food insecurity:    Worry: Not on file    Inability: Not on file  . Transportation needs:    Medical: Not on file    Non-medical: Not on file  Tobacco Use  . Smoking status: Current Every Day Smoker    Types: Cigars  . Smokeless tobacco: Never Used  . Tobacco comment: 1 -2 cigars a day.  Substance and Sexual Activity  . Alcohol use: Yes  Comment: 2 - 4 beers on the weekend.  . Drug use: No  . Sexual activity: Not on file  Lifestyle  . Physical activity:    Days per week: Not on file    Minutes per session: Not on file  . Stress: Not on file  Relationships  . Social connections:    Talks on phone: Not on file    Gets together: Not on file    Attends religious service: Not on file    Active member of club or organization: Not on file    Attends meetings of clubs or organizations: Not on file    Relationship status: Not on  file  Other Topics Concern  . Not on file  Social History Narrative  . Not on file   Family History  Problem Relation Age of Onset  . Hypertension Brother   . CAD Maternal Uncle   . Diabetes Mellitus II Maternal Uncle   . Kidney disease Brother    History reviewed. No pertinent surgical history.    Mardella LaymanHagler, Tilda Samudio, MD 08/29/18 1310

## 2019-06-30 ENCOUNTER — Telehealth: Payer: Self-pay | Admitting: Internal Medicine

## 2019-06-30 NOTE — Telephone Encounter (Signed)
error 

## 2019-07-16 ENCOUNTER — Ambulatory Visit (HOSPITAL_COMMUNITY)
Admission: EM | Admit: 2019-07-16 | Discharge: 2019-07-16 | Disposition: A | Discharge status: Home / Self Care | Payer: BC Managed Care – PPO | Attending: Family Medicine | Admitting: Family Medicine

## 2019-07-16 ENCOUNTER — Other Ambulatory Visit: Payer: Self-pay

## 2019-07-16 ENCOUNTER — Ambulatory Visit (INDEPENDENT_AMBULATORY_CARE_PROVIDER_SITE_OTHER): Payer: BC Managed Care – PPO

## 2019-07-16 ENCOUNTER — Encounter (HOSPITAL_COMMUNITY): Payer: Self-pay | Admitting: Emergency Medicine

## 2019-07-16 DIAGNOSIS — M1711 Unilateral primary osteoarthritis, right knee: Secondary | ICD-10-CM | POA: Diagnosis not present

## 2019-07-16 MED ORDER — METHYLPREDNISOLONE ACETATE 80 MG/ML IJ SUSP
INTRAMUSCULAR | Status: AC
  Filled 2019-07-16: qty 1

## 2019-07-16 MED ORDER — BUPIVACAINE HCL (PF) 0.5 % IJ SOLN
INTRAMUSCULAR | Status: AC
  Filled 2019-07-16: qty 10

## 2019-07-16 NOTE — ED Provider Notes (Signed)
[facility]    CSN: [id number] Arrival date & time: 07/16/19  1445      History   Chief Complaint Chief Complaint  Patient presents with  . Knee Pain    HPI Louis Gibson is a 53 y.o. male.   Patient presents today with pain in right knee.  He awoke this morning with swelling.  There is no history of any injury except in the remote past playing football as a teenager.  There is some suggestion of knee giving way or locking but pain does not localize over outside inside or elsewhere. Upon further questioning patient does have a history of degenerative arthritis and has had several knee injections per orthopedics.  He failed to share this information initially, but requested he would like another injection based on x-ray findings and my exam  HPI  Past Medical History:  Diagnosis Date  . Arthritis   . Hypertension     Patient Active Problem List   Diagnosis Date Noted  . Chest pain 08/11/2015  . Essential hypertension 08/11/2015  . Tobacco abuse 08/11/2015  . Retrosternal chest pain 08/10/2015    History reviewed. No pertinent surgical history.     Home Medications    Prior to Admission medications   Medication Sig Start Date End Date Taking? Authorizing Provider  naproxen (NAPROSYN) 500 MG tablet Take 1 tablet (500 mg total) by mouth 2 (two) times daily with a meal. 06/27/16  Yes Harris, Abigail, PA-C  amLODipine (NORVASC) 5 MG tablet Take 5 mg by mouth daily.    [provider]  aspirin EC 81 MG tablet Take 81 mg by mouth daily.    [provider]  cyclobenzaprine (FLEXERIL) 10 MG tablet Take 1 tablet (10 mg total) by mouth 2 (two) times daily as needed for muscle spasms. 06/27/16   Margarita Mail, PA-C  diclofenac (CATAFLAM) 50 MG tablet Take 1 tablet (50 mg total) by mouth 3 (three) times daily. Patient not taking: Reported on 08/10/2015 05/20/15   Delos Haring, PA-C  diclofenac (VOLTAREN) 75 MG EC tablet Take 1 tablet (75 mg  total) by mouth 2 (two) times daily. 08/29/18   Vanessa Kick, MD  Fish Oil OIL Take 1 capsule by mouth 2 (two) times daily.    [provider]  GARLIC PO Take 1 tablet by mouth 2 (two) times daily.    [provider]  HYDROcodone-acetaminophen (NORCO) 5-325 MG tablet Take 1-2 tablets by mouth every 6 (six) hours as needed for moderate pain. 06/27/16   Margarita Mail, PA-C  meloxicam (MOBIC) 15 MG tablet Take 15 mg by mouth daily as needed for pain.  07/23/15   [provider]  Misc Natural Products (GLUCOSAMINE CHONDROITIN COMPLX) TABS Take 1 tablet by mouth 2 (two) times daily.    [provider]    Family History Family History  Problem Relation Age of Onset  . Hypertension Brother   . CAD Maternal Uncle   . Diabetes Mellitus II Maternal Uncle   . Kidney disease Brother     Social History Social History   Tobacco Use  . Smoking status: Current Every Day Smoker    Types: Cigars  . Smokeless tobacco: Never Used  . Tobacco comment: 1 -2 cigars a day.  Substance Use Topics  . Alcohol use: Yes    Comment: 2 - 4 beers on the weekend.  . Drug use: No     Allergies   Eggs or egg-derived products   Review of  Systems Review of Systems  Musculoskeletal: Positive for arthralgias and joint swelling.  All other systems reviewed and are negative.    Physical Exam Triage Vital Signs ED Triage Vitals  Enc Vitals Group     BP 07/16/19 1614 132/88     Pulse Rate 07/16/19 1614 76     Resp 07/16/19 1614 18     Temp 07/16/19 1614 98.2 F (36.8 C)     Temp Source 07/16/19 1614 Oral     SpO2 07/16/19 1614 98 %     Weight --      Height --      Head Circumference --      Peak Flow --      Pain Score 07/16/19 1609 8     Pain Loc --      Pain Edu? --      Excl. in GC? --    No data found.  Updated Vital Signs BP 132/88 (BP Location: Right Arm) Comment (BP Location): large cuff  Pulse 76   Temp 98.2 F (36.8 C) (Oral)   Resp 18   SpO2 98%    Visual Acuity Right Eye Distance:   Left Eye Distance:   Bilateral Distance:    Right Eye Near:   Left Eye Near:    Bilateral Near:     Physical Exam Vitals signs and nursing note reviewed.  Constitutional:      Appearance: Normal appearance. He is obese.  Musculoskeletal:     Comments: Right knee there is some effusion present. There is no joint instability to exam.  ACL appears intact. There is no crepitance with flexion and extension.  Neurological:     Mental Status: He is alert.      UC Treatments / Results  Labs (all labs ordered are listed, but only abnormal results are displayed) Labs Reviewed - No data to display  EKG   Radiology No results found.  Procedures Procedures (including critical care time)  Medications Ordered in UC Medications - No data to display  Initial Impression / Assessment and Plan / UC Course  I have reviewed the triage vital signs and the nursing notes.  Pertinent labs & imaging results that were available during my care of the patient were reviewed by me and considered in my medical decision making (see chart for details).     Degenerative joint disease, right knee Final Clinical Impressions(s) / UC Diagnoses   Final diagnoses:  None   Discharge Instructions   None    ED Prescriptions    None     PDMP not reviewed this encounter.   Frederica Kuster, MD 07/16/19 1745

## 2019-07-16 NOTE — Discharge Instructions (Addendum)
Use glucosamine for knee pain

## 2019-07-16 NOTE — ED Triage Notes (Signed)
Patient reports right knee pain for the last 24-hr.  Patient denies recent injury.  Patient has a history of arthritis

## 2019-10-15 ENCOUNTER — Emergency Department (HOSPITAL_COMMUNITY): Payer: BC Managed Care – PPO

## 2019-10-15 ENCOUNTER — Other Ambulatory Visit: Payer: Self-pay | Admitting: Infectious Diseases

## 2019-10-15 ENCOUNTER — Telehealth: Payer: Self-pay | Admitting: Infectious Diseases

## 2019-10-15 ENCOUNTER — Other Ambulatory Visit: Payer: Self-pay

## 2019-10-15 ENCOUNTER — Emergency Department (HOSPITAL_COMMUNITY)
Admission: EM | Admit: 2019-10-15 | Discharge: 2019-10-15 | Disposition: A | Discharge status: Home / Self Care | Payer: BC Managed Care – PPO | Attending: Emergency Medicine | Admitting: Emergency Medicine

## 2019-10-15 ENCOUNTER — Encounter (HOSPITAL_COMMUNITY): Payer: Self-pay | Admitting: Emergency Medicine

## 2019-10-15 DIAGNOSIS — U071 COVID-19: Secondary | ICD-10-CM

## 2019-10-15 DIAGNOSIS — R05 Cough: Secondary | ICD-10-CM | POA: Insufficient documentation

## 2019-10-15 DIAGNOSIS — Z7982 Long term (current) use of aspirin: Secondary | ICD-10-CM | POA: Diagnosis not present

## 2019-10-15 DIAGNOSIS — I10 Essential (primary) hypertension: Secondary | ICD-10-CM | POA: Insufficient documentation

## 2019-10-15 DIAGNOSIS — E119 Type 2 diabetes mellitus without complications: Secondary | ICD-10-CM | POA: Diagnosis not present

## 2019-10-15 DIAGNOSIS — Z79899 Other long term (current) drug therapy: Secondary | ICD-10-CM | POA: Insufficient documentation

## 2019-10-15 DIAGNOSIS — R0981 Nasal congestion: Secondary | ICD-10-CM | POA: Insufficient documentation

## 2019-10-15 DIAGNOSIS — F17290 Nicotine dependence, other tobacco product, uncomplicated: Secondary | ICD-10-CM | POA: Diagnosis not present

## 2019-10-15 DIAGNOSIS — M7918 Myalgia, other site: Secondary | ICD-10-CM | POA: Diagnosis present

## 2019-10-15 DIAGNOSIS — E1165 Type 2 diabetes mellitus with hyperglycemia: Secondary | ICD-10-CM

## 2019-10-15 LAB — CBC WITH DIFFERENTIAL/PLATELET
Abs Immature Granulocytes: 0.03 10*3/uL (ref 0.00–0.07)
Basophils Absolute: 0 10*3/uL (ref 0.0–0.1)
Basophils Relative: 0 %
Eosinophils Absolute: 0 10*3/uL (ref 0.0–0.5)
Eosinophils Relative: 0 %
HCT: 50.8 % (ref 39.0–52.0)
Hemoglobin: 17.7 g/dL — ABNORMAL HIGH (ref 13.0–17.0)
Immature Granulocytes: 1 %
Lymphocytes Relative: 12 %
Lymphs Abs: 0.5 10*3/uL — ABNORMAL LOW (ref 0.7–4.0)
MCH: 31.1 pg (ref 26.0–34.0)
MCHC: 34.8 g/dL (ref 30.0–36.0)
MCV: 89.3 fL (ref 80.0–100.0)
Monocytes Absolute: 0.5 10*3/uL (ref 0.1–1.0)
Monocytes Relative: 12 %
Neutro Abs: 2.9 10*3/uL (ref 1.7–7.7)
Neutrophils Relative %: 75 %
Platelets: 132 10*3/uL — ABNORMAL LOW (ref 150–400)
RBC: 5.69 MIL/uL (ref 4.22–5.81)
RDW: 11.7 % (ref 11.5–15.5)
WBC: 3.8 10*3/uL — ABNORMAL LOW (ref 4.0–10.5)
nRBC: 0 % (ref 0.0–0.2)

## 2019-10-15 LAB — COMPREHENSIVE METABOLIC PANEL
ALT: 39 U/L (ref 0–44)
AST: 41 U/L (ref 15–41)
Albumin: 3.8 g/dL (ref 3.5–5.0)
Alkaline Phosphatase: 81 U/L (ref 38–126)
Anion gap: 13 (ref 5–15)
BUN: 15 mg/dL (ref 6–20)
CO2: 24 mmol/L (ref 22–32)
Calcium: 9.5 mg/dL (ref 8.9–10.3)
Chloride: 97 mmol/L — ABNORMAL LOW (ref 98–111)
Creatinine, Ser: 1.05 mg/dL (ref 0.61–1.24)
GFR calc Af Amer: >60 mL/min (ref >60)
GFR calc non Af Amer: >60 mL/min (ref >60)
Glucose, Bld: 346 mg/dL — ABNORMAL HIGH (ref 70–99)
Potassium: 3.9 mmol/L (ref 3.5–5.1)
Sodium: 134 mmol/L — ABNORMAL LOW (ref 135–145)
Total Bilirubin: 1 mg/dL (ref 0.3–1.2)
Total Protein: 7.4 g/dL (ref 6.5–8.1)

## 2019-10-15 LAB — HEMOGLOBIN A1C
Hgb A1c MFr Bld: 8.3 % — ABNORMAL HIGH (ref 4.8–5.6)
Mean Plasma Glucose: 191.51 mg/dL

## 2019-10-15 LAB — POC SARS CORONAVIRUS 2 AG -  ED: SARS Coronavirus 2 Ag: POSITIVE — AB

## 2019-10-15 LAB — CBG MONITORING, ED: Glucose-Capillary: 351 mg/dL — ABNORMAL HIGH (ref 70–99)

## 2019-10-15 MED ORDER — METFORMIN HCL 500 MG PO TABS: 500.0000 mg | ORAL_TABLET | Freq: Two times a day (BID) | ORAL | 0 refills | Status: DC

## 2019-10-15 NOTE — Discharge Instructions (Signed)
You tested positive for coronavirus today.  You will need to quarantine at home until 10 days from symptom onset.  You may end quarantine if you are fever free and your symptoms are not worsening. Closely monitoring your breathing.  If you have worsening breathing or shortness of breath, return to the emergency room to be evaluated. Treat symptomatically.  Use Tylenol and ibuprofen as needed for fever, body aches, or chills.  Use cough medicine as needed.  Your labs showed that you have diabetes. It is important that you be careful with your diet.  There is information about this in the paperwork. Make sure you stay well-hydrated water. Take Metformin as prescribed. Call your doctor tomorrow and tell them you has diabetes and need to follow-up in the office.  Return to the emergency room if any new, sudden, concerning symptoms.

## 2019-10-15 NOTE — ED Triage Notes (Signed)
Patient arrives to ED with complaints of chills, runny nose, body aches, and cold sweat since Monday. States brother was dx with COVID last week.

## 2019-10-15 NOTE — Progress Notes (Signed)
  I connected by phone with Louis Gibson on 10/15/2019 at 3:28 PM to discuss the potential use of an new treatment for mild to moderate COVID-19 viral infection in non-hospitalized patients.  This patient is a 54 y.o. male that meets the FDA criteria for Emergency Use Authorization of bamlanivimab or casirivimab\imdevimab.  Has a (+) direct SARS-CoV-2 viral test result  Has mild or moderate COVID-19   Is ? 56 years of age and weighs ? 40 kg  Is NOT hospitalized due to COVID-19  Is NOT requiring oxygen therapy or requiring an increase in baseline oxygen flow rate due to COVID-19  Is within 10 days of symptom onset  Has at least one of the high risk factor(s) for progression to severe COVID-19 and/or hospitalization as defined in EUA.  Specific high risk criteria : Diabetes   I have spoken and communicated the following to the patient or parent/caregiver:  1. FDA has authorized the emergency use of bamlanivimab and casirivimab\imdevimab for the treatment of mild to moderate COVID-19 in adults and pediatric patients with positive results of direct SARS-CoV-2 viral testing who are 66 years of age and older weighing at least 40 kg, and who are at high risk for progressing to severe COVID-19 and/or hospitalization.  2. The significant known and potential risks and benefits of bamlanivimab and casirivimab\imdevimab, and the extent to which such potential risks and benefits are unknown.  3. Information on available alternative treatments and the risks and benefits of those alternatives, including clinical trials.  4. Patients treated with bamlanivimab and casirivimab\imdevimab should continue to self-isolate and use infection control measures (e.g., wear mask, isolate, social distance, avoid sharing personal items, clean and disinfect "high touch" surfaces, and frequent handwashing) according to CDC guidelines.   5. The patient or parent/caregiver has the option to accept or refuse  bamlanivimab or casirivimab\imdevimab .  After reviewing this information with the patient, The patient agreed to proceed with receiving the bamlanimivab infusion and will be provided a copy of the Fact sheet prior to receiving the infusion.Louis Gibson 10/15/2019 3:28 PM

## 2019-10-15 NOTE — ED Notes (Signed)
Patient verbalizes understanding of discharge instructions. Opportunity for questioning and answers were provided. Armband removed by staff, pt discharged from ED.  

## 2019-10-15 NOTE — Telephone Encounter (Signed)
Called to discuss with patient about Covid symptoms and the use of bamlanivimab, a monoclonal antibody infusion for those with mild to moderate Covid symptoms and at a high risk of hospitalization.  Pt is qualified for this infusion at the [facility] infusion center due to Age >55, Diabetes and Hypertension.   His symptoms started about 2 days ago with onset of chills, nasal congestion and body aches. He denies cough at this time.  He was diagnosed with diabetes and started on metformin during ER visit today. Has a PCP for follow up on this along with his HTN.   He lives with his girlfriend and her daughter and brother. Brother diagnosed with COVID > 10d ago and in and out of hospital per [facility]'s description.   Instructions discussed and provided for informed consent. MyChart sent as well for reinforcement of details.    Rexene Alberts, MSN, NP-C [facility] for Infectious Disease [facility]  [email or web address] Pager: [phone] Office: [phone] [facility] Main Line: [phone]

## 2019-10-15 NOTE — ED Provider Notes (Signed)
[facility] EMERGENCY DEPARTMENT Provider Note   CSN: [id number] Arrival date & time: 10/15/19  3474     History Chief Complaint  Patient presents with  . Chills  . Generalized Body Aches    Louis Gibson is a 54 y.o. male presenting for evaluation chills, body aches, and cough.  Patient states 2 days ago he developed symptoms. It began with chills and hot sweats. Yesterday he had generalized body aches and a mild cough. He reports nasal congestion and feeling like he is very thirsty. He denies documented fever, ear pain, chest pain, shortness breath, nausea, vomiting, dumping, urinary symptoms, abnormal bowel movements. His brother who stays with him occasionally tested positive for Covid a week and a half ago and is currently admitted to the hospital. He denies other sick contacts. He has a history of arthritis and hypertension for taking medication, no other medical problems. No history of diabetes, although he does have a family history of this. He smokes cigars. No history of asthma or COPD. He has not taken anything for his symptoms.  HPI     Past Medical History:  Diagnosis Date  . Arthritis   . Hypertension     Patient Active Problem List   Diagnosis Date Noted  . Chest pain 08/11/2015  . Essential hypertension 08/11/2015  . Tobacco abuse 08/11/2015  . Retrosternal chest pain 08/10/2015    History reviewed. No pertinent surgical history.     Family History  Problem Relation Age of Onset  . Hypertension Brother   . CAD Maternal Uncle   . Diabetes Mellitus II Maternal Uncle   . Kidney disease Brother     Social History   Tobacco Use  . Smoking status: Current Every Day Smoker    Types: Cigars  . Smokeless tobacco: Never Used  . Tobacco comment: 1 -2 cigars a day.  Substance Use Topics  . Alcohol use: Yes    Comment: 2 - 4 beers on the weekend.  . Drug use: No    Home Medications Prior to Admission medications   Medication Sig  Start Date End Date Taking? Authorizing Provider  amLODipine (NORVASC) 5 MG tablet Take 5 mg by mouth daily.    [provider]  aspirin EC 81 MG tablet Take 81 mg by mouth daily.    [provider]  cyclobenzaprine (FLEXERIL) 10 MG tablet Take 1 tablet (10 mg total) by mouth 2 (two) times daily as needed for muscle spasms. 06/27/16   Arthor Captain, PA-C  diclofenac (CATAFLAM) 50 MG tablet Take 1 tablet (50 mg total) by mouth 3 (three) times daily. Patient not taking: Reported on 08/10/2015 05/20/15   Marlon Pel, PA-C  diclofenac (VOLTAREN) 75 MG EC tablet Take 1 tablet (75 mg total) by mouth 2 (two) times daily. 08/29/18   Mardella Layman, MD  Fish Oil OIL Take 1 capsule by mouth 2 (two) times daily.    [provider]  GARLIC PO Take 1 tablet by mouth 2 (two) times daily.    [provider]  HYDROcodone-acetaminophen (NORCO) 5-325 MG tablet Take 1-2 tablets by mouth every 6 (six) hours as needed for moderate pain. 06/27/16   Arthor Captain, PA-C  meloxicam (MOBIC) 15 MG tablet Take 15 mg by mouth daily as needed for pain.  07/23/15   [provider]  metFORMIN (GLUCOPHAGE) 500 MG tablet Take 1 tablet (500 mg total) by mouth 2 (two) times daily for 21 days. 10/15/19 11/05/19  Audie Wieser, Jeanette Caprice, PA-C  Misc Natural Products (GLUCOSAMINE CHONDROITIN COMPLX) TABS Take 1 tablet by mouth 2 (two) times daily.    [provider]  naproxen (NAPROSYN) 500 MG tablet Take 1 tablet (500 mg total) by mouth 2 (two) times daily with a meal. 06/27/16   Margarita Mail, PA-C    Allergies    Eggs or egg-derived products  Review of Systems   Review of Systems  Constitutional: Positive for chills.  HENT: Positive for congestion.   Respiratory: Positive for cough.   Musculoskeletal: Positive for myalgias.    Physical Exam Updated Vital Signs BP 132/90 (BP Location: Left Arm)   Pulse (!) 104   Temp 98.5 F (36.9 C) (Oral)   Resp 20   SpO2 97%    Physical Exam Vitals and nursing note reviewed.  Constitutional:      General: He is not in acute distress.    Appearance: He is well-developed.     Comments: Resting comfortably in the chair no acute distress  HENT:     Head: Normocephalic and atraumatic.  Eyes:     Extraocular Movements: Extraocular movements intact.     Conjunctiva/sclera: Conjunctivae normal.     Pupils: Pupils are equal, round, and reactive to light.  Cardiovascular:     Rate and Rhythm: Normal rate and regular rhythm.     Pulses: Normal pulses.     Comments: Heart rate normal on my exam Pulmonary:     Effort: Pulmonary effort is normal. No respiratory distress.     Breath sounds: Normal breath sounds. No wheezing.     Comments: Clear lung sounds in all fields. Speaking full sentences. Sats stable on room air. Abdominal:     General: There is no distension.     Palpations: Abdomen is soft. There is no mass.     Tenderness: There is no abdominal tenderness. There is no guarding or rebound.  Musculoskeletal:        General: Normal range of motion.     Cervical back: Normal range of motion and neck supple.  Skin:    General: Skin is warm and dry.     Capillary Refill: Capillary refill takes less than 2 seconds.  Neurological:     Mental Status: He is alert and oriented to person, place, and time.     ED Results / Procedures / Treatments   Labs (all labs ordered are listed, but only abnormal results are displayed) Labs Reviewed  CBC WITH DIFFERENTIAL/PLATELET - Abnormal; Notable for the following components:      Result Value   WBC 3.8 (*)    Hemoglobin 17.7 (*)    Platelets 132 (*)    Lymphs Abs 0.5 (*)    All other components within normal limits  COMPREHENSIVE METABOLIC PANEL - Abnormal; Notable for the following components:   Sodium 134 (*)    Chloride 97 (*)    Glucose, Bld 346 (*)    All other components within normal limits  HEMOGLOBIN A1C - Abnormal; Notable for the following  components:   Hgb A1c MFr Bld 8.3 (*)    All other components within normal limits  CBG MONITORING, ED - Abnormal; Notable for the following components:   Glucose-Capillary 351 (*)    All other components within normal limits  POC SARS CORONAVIRUS 2 AG -  ED - Abnormal; Notable for the following components:   SARS Coronavirus 2 Ag POSITIVE (*)    All other components within normal limits    EKG None  Radiology DG Chest Portable 1 View  Result Date: 10/15/2019 CLINICAL DATA:  Cough shortness of breath, chills for 2 days EXAM: PORTABLE CHEST 1 VIEW COMPARISON:  08/10/2015 FINDINGS: Cardiomediastinal contours are stable. Hilar structures are unremarkable. Lungs are clear. No signs of pleural effusion. No acute bone finding. IMPRESSION: No acute cardiopulmonary disease. Electronically Signed   By: Donzetta Kohut M.D.   On: 10/15/2019 11:11    Procedures Procedures (including critical care time)  Medications Ordered in ED Medications - No data to display  ED Course  I have reviewed the triage vital signs and the nursing notes.  Pertinent labs & imaging results that were available during my care of the patient were reviewed by me and considered in my medical decision making (see chart for details).    MDM Rules/Calculators/A&P                      Patient presenting for evaluation of chills, body aches, cough. Physical exam shows patient appears nontoxic. He does have a recent Covid contact, consider coronavirus. Consider other viral illness. Less likely bacterial infection, will obtain x-ray to assess lungs. As patient reports feeling very thirsty, will obtain CBG. Does not have a history of diabetes, and states he had PCP checkup 1 month ago with blood work which was normal. As such, doubt diabetes, but will assess for extreme hyperglycemia.  Blood sugar elevated at 351.  Patient ate a sandwich 60 to 90 minutes ago, and drink ginger ale.  This could be causing the elevated blood  sugar, however in the setting of hypoglycemia, sweats, thirst, body aches, will obtain labs to assess for electrolyte abnormalities.  Will obtain hemoglobin A1c for further evaluation of diabetes.  Labs interpreted by me.  He will globin A1c elevated at 8.3, consistent with diabetes.  Bicarb and gap normal, patient appears nontoxic.  No evidence for DKA at this time.  Will start patient on Metformin as he has normal creatinine and have him follow-up closely with his primary care doctor.  Additionally, patient is Covid positive.  Discussed symptomatic treatment and close monitoring.  Discussed return with worsening respiratory status.  At this time, patient appears safe for discharge.  Return precautions given.  Patient states he understands and agrees to plan.  Reshard Guillet was evaluated in Emergency Department on 10/15/2019 for the symptoms described in the history of present illness. He was evaluated in the context of the global COVID-19 pandemic, which necessitated consideration that the patient might be at risk for infection with the SARS-CoV-2 virus that causes COVID-19. Institutional protocols and algorithms that pertain to the evaluation of patients at risk for COVID-19 are in a state of rapid change based on information released by regulatory bodies including the CDC and federal and state organizations. These policies and algorithms were followed during the patient's care in the ED.  Final Clinical Impression(s) / ED Diagnoses Final diagnoses:  COVID-19  Type 2 diabetes mellitus without complication, without long-term current use of insulin (HCC)    Rx / DC Orders ED Discharge Orders         Ordered    metFORMIN (GLUCOPHAGE) 500 MG tablet  2 times daily     10/15/19 1201           Cailan General, PA-C 10/15/19 1206    Benjiman Core, MD 10/16/19 1554

## 2019-10-16 ENCOUNTER — Ambulatory Visit (HOSPITAL_COMMUNITY)
Admission: RE | Admit: 2019-10-16 | Discharge: 2019-10-16 | Disposition: A | Discharge status: Home / Self Care | Payer: BC Managed Care – PPO | Source: Ambulatory Visit | Attending: Pulmonary Disease | Admitting: Pulmonary Disease

## 2019-10-16 DIAGNOSIS — I10 Essential (primary) hypertension: Secondary | ICD-10-CM | POA: Diagnosis present

## 2019-10-16 DIAGNOSIS — E1165 Type 2 diabetes mellitus with hyperglycemia: Secondary | ICD-10-CM | POA: Insufficient documentation

## 2019-10-16 DIAGNOSIS — U071 COVID-19: Secondary | ICD-10-CM | POA: Diagnosis present

## 2019-10-16 MED ORDER — EPINEPHRINE 0.3 MG/0.3ML IJ SOAJ: 0.3000 mg | Freq: Once | INTRAMUSCULAR | Status: DC | PRN

## 2019-10-16 MED ORDER — SODIUM CHLORIDE 0.9 % IV SOLN
INTRAVENOUS | Status: DC | PRN
  Administered 2019-10-16: 250 mL via INTRAVENOUS

## 2019-10-16 MED ORDER — DIPHENHYDRAMINE HCL 50 MG/ML IJ SOLN: 50.0000 mg | Freq: Once | INTRAMUSCULAR | Status: DC | PRN

## 2019-10-16 MED ORDER — METHYLPREDNISOLONE SODIUM SUCC 125 MG IJ SOLR: 125.0000 mg | Freq: Once | INTRAMUSCULAR | Status: DC | PRN

## 2019-10-16 MED ORDER — ALBUTEROL SULFATE HFA 108 (90 BASE) MCG/ACT IN AERS: 2.0000 | INHALATION_SPRAY | Freq: Once | RESPIRATORY_TRACT | Status: DC | PRN

## 2019-10-16 MED ORDER — FAMOTIDINE IN NACL 20-0.9 MG/50ML-% IV SOLN: 20.0000 mg | Freq: Once | INTRAVENOUS | Status: DC | PRN

## 2019-10-16 MED ORDER — SODIUM CHLORIDE 0.9 % IV SOLN
700.0000 mg | Freq: Once | INTRAVENOUS | Status: AC
  Administered 2019-10-16: 700 mg via INTRAVENOUS
  Filled 2019-10-16: qty 20

## 2019-10-16 NOTE — Progress Notes (Signed)
  Diagnosis: COVID-19  Physician: Dr. Wright  Procedure: Covid Infusion Clinic Med: bamlanivimab infusion - Provided patient with bamlanimivab fact sheet for patients, parents and caregivers prior to infusion.  Complications: No immediate complications noted.  Discharge: Discharged home   Jackeline Gutknecht 10/16/2019   

## 2019-10-16 NOTE — Discharge Instructions (Signed)

## 2020-04-27 ENCOUNTER — Ambulatory Visit (HOSPITAL_COMMUNITY)
Admission: EM | Admit: 2020-04-27 | Discharge: 2020-04-27 | Disposition: A | Discharge status: Home / Self Care | Payer: BC Managed Care – PPO | Attending: Family Medicine | Admitting: Family Medicine

## 2020-04-27 ENCOUNTER — Other Ambulatory Visit: Payer: Self-pay

## 2020-04-27 DIAGNOSIS — S8391XA Sprain of unspecified site of right knee, initial encounter: Secondary | ICD-10-CM | POA: Diagnosis not present

## 2020-04-27 DIAGNOSIS — M25561 Pain in right knee: Secondary | ICD-10-CM

## 2020-04-27 MED ORDER — PREDNISONE 10 MG (21) PO TBPK: ORAL_TABLET | Freq: Every day | ORAL | 0 refills | Status: AC

## 2020-04-27 NOTE — Discharge Instructions (Signed)
Take ibuprofen as needed.  Rest and elevate your leg. Apply ice packs 2-3 times a day for up to 20 minutes each.     Follow up with your primary care provider or an orthopedist if you symptoms continue or worsen;  Or if you develop new symptoms, such as numbness, tingling, or weakness.     

## 2020-04-27 NOTE — ED Provider Notes (Signed)
[facility] CARE CENTER   [id number] 04/27/20 Arrival Time: 1055  JQ:[id number] PAIN  SUBJECTIVE: History from: patient. Louis Gibson is a 54 y.o. male complains of right knee pain that began 1 week ago.  Denies a precipitating event or specific injury.  Localizes the pain to the medial aspect pf the right. Reports that the knee pops as well. Describes the pain as  intermittent and achy in character. Has tried OTC medications without relief. Symptoms are made worse with activity. Denies similar symptoms in the past. Denies fever, chills, erythema, ecchymosis, effusion, weakness, numbness and tingling, saddle paresthesias, loss of bowel or bladder function.      ROS: As per HPI.  All other pertinent ROS negative.     Past Medical History:  Diagnosis Date  . Arthritis   . Hypertension    No past surgical history on file. Allergies  Allergen Reactions  . Eggs Or Egg-Derived Products Nausea And Vomiting   No current facility-administered medications on file prior to encounter.   Current Outpatient Medications on File Prior to Encounter  Medication Sig Dispense Refill  . amLODipine (NORVASC) 5 MG tablet Take 5 mg by mouth daily.    Marland Kitchen aspirin EC 81 MG tablet Take 81 mg by mouth daily.    . cyclobenzaprine (FLEXERIL) 10 MG tablet Take 1 tablet (10 mg total) by mouth 2 (two) times daily as needed for muscle spasms. 20 tablet 0  . diclofenac (CATAFLAM) 50 MG tablet Take 1 tablet (50 mg total) by mouth 3 (three) times daily. (Patient not taking: Reported on 08/10/2015) 30 tablet 0  . diclofenac (VOLTAREN) 75 MG EC tablet Take 1 tablet (75 mg total) by mouth 2 (two) times daily. 14 tablet 0  . Fish Oil OIL Take 1 capsule by mouth 2 (two) times daily.    Marland Kitchen GARLIC PO Take 1 tablet by mouth 2 (two) times daily.    Marland Kitchen HYDROcodone-acetaminophen (NORCO) 5-325 MG tablet Take 1-2 tablets by mouth every 6 (six) hours as needed for moderate pain. 20 tablet 0  . meloxicam (MOBIC) 15 MG tablet Take 15 mg by  mouth daily as needed for pain.   5  . metFORMIN (GLUCOPHAGE) 500 MG tablet Take 1 tablet (500 mg total) by mouth 2 (two) times daily for 21 days. 42 tablet 0  . Misc Natural Products (GLUCOSAMINE CHONDROITIN COMPLX) TABS Take 1 tablet by mouth 2 (two) times daily.    . naproxen (NAPROSYN) 500 MG tablet Take 1 tablet (500 mg total) by mouth 2 (two) times daily with a meal. 30 tablet 0   Social History   Socioeconomic History  . Marital status: Divorced    Spouse name: Not on file  . Number of children: Not on file  . Years of education: Not on file  . Highest education level: Not on file  Occupational History  . Not on file  Tobacco Use  . Smoking status: Current Every Day Smoker    Types: Cigars  . Smokeless tobacco: Never Used  . Tobacco comment: 1 -2 cigars a day.  Substance and Sexual Activity  . Alcohol use: Yes    Comment: 2 - 4 beers on the weekend.  . Drug use: No  . Sexual activity: Not on file  Other Topics Concern  . Not on file  Social History Narrative  . Not on file   Social Determinants of Health   Financial Resource Strain:   . Difficulty of Paying Living Expenses: Not on file  Food  Insecurity:   . Worried About Programme researcher, broadcasting/film/video in the Last Year: Not on file  . Ran Out of Food in the Last Year: Not on file  Transportation Needs:   . Lack of Transportation (Medical): Not on file  . Lack of Transportation (Non-Medical): Not on file  Physical Activity:   . Days of Exercise per Week: Not on file  . Minutes of Exercise per Session: Not on file  Stress:   . Feeling of Stress : Not on file  Social Connections:   . Frequency of Communication with Friends and Family: Not on file  . Frequency of Social Gatherings with Friends and Family: Not on file  . Attends Religious Services: Not on file  . Active Member of Clubs or Organizations: Not on file  . Attends Banker Meetings: Not on file  . Marital Status: Not on file  Intimate Partner  Violence:   . Fear of Current or Ex-Partner: Not on file  . Emotionally Abused: Not on file  . Physically Abused: Not on file  . Sexually Abused: Not on file   Family History  Problem Relation Age of Onset  . Hypertension Brother   . CAD Maternal Uncle   . Diabetes Mellitus II Maternal Uncle   . Kidney disease Brother     OBJECTIVE:  Vitals:   04/27/20 1252  BP: (!) 150/91  Pulse: 85  Resp: 20  Temp: 99.3 F (37.4 C)  TempSrc: Oral  SpO2: 99%    General appearance: ALERT; in no acute distress.  Head: NCAT Lungs: Normal respiratory effort CV:  pulses 2+ bilaterally. Cap refill < 2 seconds Musculoskeletal:  Inspection: Skin warm, dry, clear and intact without obvious erythema, effusion, or ecchymosis.  Palpation: Medial aspect of R knee and R patellar tendon TTP ROM: limited ROM active and passive to R knee Stability: Anterior/ posterior drawer intact Skin: warm and dry Neurologic: Ambulates without difficulty; Sensation intact about the upper/ lower extremities Psychological: alert and cooperative; normal mood and affect  DIAGNOSTIC STUDIES:  No results found.   ASSESSMENT & PLAN:  1. Acute pain of right knee   2. Sprain of right knee, unspecified ligament, initial encounter     Meds ordered this encounter  Medications  . predniSONE (STERAPRED UNI-PAK 21 TAB) 10 MG (21) TBPK tablet    Sig: Take by mouth daily for 6 days. Take 6 tablets on day 1, 5 tablets on day 2, 4 tablets on day 3, 3 tablets on day 4, 2 tablets on day 5, 1 tablet on day 6    Dispense:  21 tablet    Refill:  0    Order Specific Question:   Supervising Provider    Answer:   Merrilee Jansky [[id number]]   Will treat as knee sprain Possible meniscus injury Prescribed prednisone taper Continue conservative management of rest, ice, and gentle stretches Take ibuprofen as needed for pain relief (may cause abdominal discomfort, ulcers, and GI bleeds avoid taking with other NSAIDs)  Follow up  with ortho if symptoms persist Return or go to the ER if you have any new or worsening symptoms (fever, chills, chest pain, abdominal pain, changes in bowel or bladder habits, pain radiating into lower legs)   Reviewed expectations re: course of current medical issues. Questions answered. Outlined signs and symptoms indicating need for more acute intervention. Patient verbalized understanding. After Visit Summary given.       Moshe Cipro, NP 04/27/20 1327

## 2020-04-27 NOTE — ED Triage Notes (Signed)
Pt presents with right knee pain x 1 week. Pt states pain is worse when walking and having intermittent swelling in the right knee. Naproxen gives no relief.

## 2020-05-19 LAB — COLOGUARD: COLOGUARD: NEGATIVE

## 2020-05-19 LAB — EXTERNAL GENERIC LAB PROCEDURE: COLOGUARD: NEGATIVE

## 2020-12-08 ENCOUNTER — Ambulatory Visit (HOSPITAL_COMMUNITY)
Admission: EM | Admit: 2020-12-08 | Discharge: 2020-12-08 | Disposition: A | Discharge status: Home / Self Care | Payer: BC Managed Care – PPO

## 2020-12-08 ENCOUNTER — Encounter (HOSPITAL_COMMUNITY): Payer: Self-pay

## 2020-12-08 ENCOUNTER — Other Ambulatory Visit: Payer: Self-pay

## 2020-12-08 DIAGNOSIS — M1711 Unilateral primary osteoarthritis, right knee: Secondary | ICD-10-CM

## 2020-12-08 DIAGNOSIS — M25561 Pain in right knee: Secondary | ICD-10-CM

## 2020-12-08 LAB — CBG MONITORING, ED: Glucose-Capillary: 169 mg/dL — ABNORMAL HIGH (ref 70–99)

## 2020-12-08 MED ORDER — PREDNISONE 20 MG PO TABS: ORAL_TABLET | ORAL | 0 refills | Status: DC

## 2020-12-08 NOTE — ED Provider Notes (Signed)
[facility]   MRN: [id number] DOB: 03/17/1966  Subjective:   Louis Gibson is a 55 y.o. male presenting for 1 week history of persistent moderate to severe right knee pain with swelling.  Patient states that he felt a pop in his knee about a week ago and then his symptoms started.  Of note, he did have some x-rays done in 2020 that showed moderate to severe tricompartmental arthritis of the right knee.  He has not followed up with anyone about this.  Denies any additional falls, trauma, history of gout.  No current facility-administered medications for this encounter.  Current Outpatient Medications:  .  amLODipine (NORVASC) 5 MG tablet, Take 5 mg by mouth daily., Disp: , Rfl:  .  aspirin EC 81 MG tablet, Take 81 mg by mouth daily., Disp: , Rfl:  .  cyclobenzaprine (FLEXERIL) 10 MG tablet, Take 1 tablet (10 mg total) by mouth 2 (two) times daily as needed for muscle spasms., Disp: 20 tablet, Rfl: 0 .  diclofenac (CATAFLAM) 50 MG tablet, Take 1 tablet (50 mg total) by mouth 3 (three) times daily. (Patient not taking: No sig reported), Disp: 30 tablet, Rfl: 0 .  diclofenac (VOLTAREN) 75 MG EC tablet, Take 1 tablet (75 mg total) by mouth 2 (two) times daily., Disp: 14 tablet, Rfl: 0 .  Fish Oil OIL, Take 1 capsule by mouth 2 (two) times daily., Disp: , Rfl:  .  GARLIC PO, Take 1 tablet by mouth 2 (two) times daily., Disp: , Rfl:  .  glimepiride (AMARYL) 4 MG tablet, Take 4 mg by mouth daily., Disp: , Rfl:  .  hydrochlorothiazide (HYDRODIURIL) 12.5 MG tablet, Take 12.5 mg by mouth daily., Disp: , Rfl:  .  HYDROcodone-acetaminophen (NORCO) 5-325 MG tablet, Take 1-2 tablets by mouth every 6 (six) hours as needed for moderate pain., Disp: 20 tablet, Rfl: 0 .  JANUVIA 100 MG tablet, Take 100 mg by mouth daily., Disp: , Rfl:  .  meloxicam (MOBIC) 15 MG tablet, Take 15 mg by mouth daily as needed for pain. , Disp: , Rfl: 5 .  metFORMIN (GLUCOPHAGE) 500 MG tablet, Take 1 tablet  (500 mg total) by mouth 2 (two) times daily for 21 days., Disp: 42 tablet, Rfl: 0 .  Misc Natural Products (GLUCOSAMINE CHONDROITIN COMPLX) TABS, Take 1 tablet by mouth 2 (two) times daily., Disp: , Rfl:  .  naproxen (NAPROSYN) 500 MG tablet, Take 1 tablet (500 mg total) by mouth 2 (two) times daily with a meal., Disp: 30 tablet, Rfl: 0 .  Vitamin D, Ergocalciferol, (DRISDOL) 1.25 MG (50000 UNIT) CAPS capsule, Take by mouth., Disp: , Rfl:    Allergies  Allergen Reactions  . Eggs Or Egg-Derived Products Nausea And Vomiting    Past Medical History:  Diagnosis Date  . Arthritis   . Hypertension      History reviewed. No pertinent surgical history.  Family History  Problem Relation Age of Onset  . Hypertension Brother   . CAD Maternal Uncle   . Diabetes Mellitus II Maternal Uncle   . Kidney disease Brother     Social History   Tobacco Use  . Smoking status: Current Every Day Smoker    Types: Cigars  . Smokeless tobacco: Never Used  . Tobacco comment: 1 -2 cigars a day.  Substance Use Topics  . Alcohol use: Yes    Comment: 2 - 4 beers on the weekend.  . Drug use: No  ROS   Objective:   Vitals: BP (!) 148/86 (BP Location: Right Arm)   Pulse 98   Temp 97.7 F (36.5 C) (Oral)   Resp 20   SpO2 96%   Physical Exam Constitutional:      General: He is not in acute distress.    Appearance: Normal appearance. He is well-developed and normal weight. He is not ill-appearing, toxic-appearing or diaphoretic.  HENT:     Head: Normocephalic and atraumatic.     Right Ear: External ear normal.     Left Ear: External ear normal.     Nose: Nose normal.     Mouth/Throat:     Pharynx: Oropharynx is clear.  Eyes:     General: No scleral icterus.       Right eye: No discharge.        Left eye: No discharge.     Extraocular Movements: Extraocular movements intact.     Pupils: Pupils are equal, round, and reactive to light.  Cardiovascular:     Rate and Rhythm: Normal rate.   Pulmonary:     Effort: Pulmonary effort is normal.  Musculoskeletal:     Cervical back: Normal range of motion.     Right knee: Swelling present. No deformity, erythema, ecchymosis, lacerations, bony tenderness or crepitus. Decreased range of motion. Tenderness present over the medial joint line, lateral joint line and patellar tendon. Normal alignment and normal patellar mobility.  Neurological:     Mental Status: He is alert and oriented to person, place, and time.     Motor: No weakness.     Coordination: Coordination normal.     Gait: Gait normal.     Deep Tendon Reflexes: Reflexes normal.  Psychiatric:        Mood and Affect: Mood normal.        Behavior: Behavior normal.        Thought Content: Thought content normal.        Judgment: Judgment normal.     Results for orders placed or performed during the hospital encounter of 12/08/20 (from the past 24 hour(s))  POC CBG monitoring     Status: Abnormal   Collection Time: 12/08/20  2:38 PM  Result Value Ref Range   Glucose-Capillary 169 (H) 70 - 99 mg/dL     Assessment and Plan :   PDMP not reviewed this encounter.  1. Arthritis of right knee   2. Acute pain of right knee     Will use an oral prednisone course for recurrent arthritis flare. Recommended establishing care with an orthopedist for a consultation including further imaging, future management. Counseled patient on potential for adverse effects with medications prescribed/recommended today, ER and return-to-clinic precautions discussed, patient verbalized understanding.    Wallis Bamberg, PA-C 12/08/20 1440

## 2020-12-08 NOTE — ED Triage Notes (Signed)
Pt reports swelling in the right knee :on and off", numbness and tingling in the right toe x 1 week. Pt reports this happened before but never last for so long. Pt reports he has arthritis.

## 2021-01-04 ENCOUNTER — Other Ambulatory Visit: Payer: Self-pay

## 2021-01-04 ENCOUNTER — Encounter (HOSPITAL_COMMUNITY): Payer: Self-pay

## 2021-01-04 ENCOUNTER — Ambulatory Visit (HOSPITAL_COMMUNITY)
Admission: EM | Admit: 2021-01-04 | Discharge: 2021-01-04 | Disposition: A | Discharge status: Home / Self Care | Payer: BC Managed Care – PPO | Attending: Family Medicine | Admitting: Family Medicine

## 2021-01-04 DIAGNOSIS — Z79899 Other long term (current) drug therapy: Secondary | ICD-10-CM | POA: Insufficient documentation

## 2021-01-04 DIAGNOSIS — F17290 Nicotine dependence, other tobacco product, uncomplicated: Secondary | ICD-10-CM | POA: Insufficient documentation

## 2021-01-04 DIAGNOSIS — J069 Acute upper respiratory infection, unspecified: Secondary | ICD-10-CM | POA: Insufficient documentation

## 2021-01-04 DIAGNOSIS — Z791 Long term (current) use of non-steroidal anti-inflammatories (NSAID): Secondary | ICD-10-CM | POA: Diagnosis not present

## 2021-01-04 DIAGNOSIS — I10 Essential (primary) hypertension: Secondary | ICD-10-CM | POA: Diagnosis not present

## 2021-01-04 DIAGNOSIS — Z20822 Contact with and (suspected) exposure to covid-19: Secondary | ICD-10-CM | POA: Diagnosis not present

## 2021-01-04 HISTORY — DX: Type 2 diabetes mellitus without complications: E11.9

## 2021-01-04 MED ORDER — CETIRIZINE HCL 10 MG PO TABS: 10.0000 mg | ORAL_TABLET | Freq: Every day | ORAL | 2 refills | Status: DC

## 2021-01-04 MED ORDER — FLUTICASONE PROPIONATE 50 MCG/ACT NA SUSP: 1.0000 | Freq: Two times a day (BID) | NASAL | 2 refills | Status: DC

## 2021-01-04 NOTE — ED Provider Notes (Signed)
[facility]    CSN: [id number] Arrival date & time: 01/04/21  0909      History   Chief Complaint Chief Complaint  Patient presents with  . Nasal Congestion    HPI Louis Gibson is a 55 y.o. male.   Patient presenting today with 2 to 3-day history of runny nose, congestion, mild intermittent hacking cough.  States the symptoms are worse when he first wakes up in the morning or when he goes outside.  Some sinus pressure but otherwise no headache, sore throat, chest pain, shortness of breath, wheezing, fever, chills, body aches.  No known sick contacts recently.  Known history of seasonal allergies but does not take medication for this because he states they are not usually that bad.  No known past history of pulmonary issues.     Past Medical History:  Diagnosis Date  . Arthritis   . Diabetes mellitus without complication (HCC)   . Hypertension     Patient Active Problem List   Diagnosis Date Noted  . Chest pain 08/11/2015  . Essential hypertension 08/11/2015  . Tobacco abuse 08/11/2015  . Retrosternal chest pain 08/10/2015    History reviewed. No pertinent surgical history.     Home Medications    Prior to Admission medications   Medication Sig Start Date End Date Taking? Authorizing Provider  cetirizine (ZYRTEC ALLERGY) 10 MG tablet Take 1 tablet (10 mg total) by mouth daily. 01/04/21  Yes Particia Nearing, PA-C  fluticasone [facility]) 50 MCG/ACT nasal spray Place 1 spray into both nostrils in the morning and at bedtime. 01/04/21  Yes Particia Nearing, PA-C  amLODipine (NORVASC) 5 MG tablet Take 5 mg by mouth daily.    [provider]  aspirin EC 81 MG tablet Take 81 mg by mouth daily.    [provider]  cyclobenzaprine (FLEXERIL) 10 MG tablet Take 1 tablet (10 mg total) by mouth 2 (two) times daily as needed for muscle spasms. 06/27/16   Arthor Captain, PA-C  diclofenac (CATAFLAM) 50 MG tablet Take 1 tablet (50 mg total)  by mouth 3 (three) times daily. Patient not taking: No sig reported 05/20/15   Marlon Pel, PA-C  diclofenac (VOLTAREN) 75 MG EC tablet Take 1 tablet (75 mg total) by mouth 2 (two) times daily. 08/29/18   Mardella Layman, MD  Fish Oil OIL Take 1 capsule by mouth 2 (two) times daily.    [provider]  GARLIC PO Take 1 tablet by mouth 2 (two) times daily.    [provider]  glimepiride (AMARYL) 4 MG tablet Take 4 mg by mouth daily. 11/21/20   [provider]  hydrochlorothiazide (HYDRODIURIL) 12.5 MG tablet Take 12.5 mg by mouth daily. 11/09/20   [provider]  HYDROcodone-acetaminophen (NORCO) 5-325 MG tablet Take 1-2 tablets by mouth every 6 (six) hours as needed for moderate pain. 06/27/16   Harris, Abigail, PA-C  JANUVIA 100 MG tablet Take 100 mg by mouth daily. 11/21/20   [provider]  meloxicam (MOBIC) 15 MG tablet Take 15 mg by mouth daily as needed for pain.  07/23/15   [provider]  metFORMIN (GLUCOPHAGE) 500 MG tablet Take 1 tablet (500 mg total) by mouth 2 (two) times daily for 21 days. 10/15/19 11/05/19  Caccavale, Sophia, PA-C  Misc Natural Products (GLUCOSAMINE CHONDROITIN COMPLX) TABS Take 1 tablet by mouth 2 (two) times daily.    [provider]  naproxen (NAPROSYN) 500 MG tablet Take 1 tablet (500  mg total) by mouth 2 (two) times daily with a meal. 06/27/16   Arthor Captain, PA-C  predniSONE (DELTASONE) 20 MG tablet Take 2 tablets daily with breakfast. 12/08/20   Wallis Bamberg, PA-C  Vitamin D, Ergocalciferol, (DRISDOL) 1.25 MG (50000 UNIT) CAPS capsule Take by mouth. 11/22/20   [provider]    Family History Family History  Problem Relation Age of Onset  . Hypertension Brother   . CAD Maternal Uncle   . Diabetes Mellitus II Maternal Uncle   . Kidney disease Brother     Social History Social History   Tobacco Use  . Smoking status: Current Every Day Smoker    Types: Cigars  . Smokeless tobacco: Never  Used  . Tobacco comment: 1 -2 cigars a day.  Substance Use Topics  . Alcohol use: Yes    Comment: 2 - 4 beers on the weekend.  . Drug use: No     Allergies   Eggs or egg-derived products   Review of Systems Review of Systems Per HPI  Physical Exam Triage Vital Signs ED Triage Vitals  Enc Vitals Group     BP 01/04/21 1123 (!) 149/95     Pulse Rate 01/04/21 1123 86     Resp 01/04/21 1123 19     Temp 01/04/21 1123 99.3 F (37.4 C)     Temp Source 01/04/21 1123 Oral     SpO2 01/04/21 1123 97 %     Weight --      Height --      Head Circumference --      Peak Flow --      Pain Score 01/04/21 1121 8     Pain Loc --      Pain Edu? --      Excl. in GC? --    No data found.  Updated Vital Signs BP (!) 149/95 (BP Location: Right Arm)   Pulse 86   Temp 99.3 F (37.4 C) (Oral)   Resp 19   SpO2 97%   Visual Acuity Right Eye Distance:   Left Eye Distance:   Bilateral Distance:    Right Eye Near:   Left Eye Near:    Bilateral Near:     Physical Exam Vitals and nursing note reviewed.  Constitutional:      Appearance: Normal appearance.  HENT:     Head: Atraumatic.     Nose: Rhinorrhea present.     Mouth/Throat:     Mouth: Mucous membranes are moist.     Pharynx: Oropharynx is clear. Posterior oropharyngeal erythema present. No oropharyngeal exudate.  Eyes:     Extraocular Movements: Extraocular movements intact.     Conjunctiva/sclera: Conjunctivae normal.  Cardiovascular:     Rate and Rhythm: Normal rate and regular rhythm.  Pulmonary:     Effort: Pulmonary effort is normal. No respiratory distress.     Breath sounds: Normal breath sounds. No wheezing or rales.  Abdominal:     General: Bowel sounds are normal. There is no distension.     Palpations: Abdomen is soft.     Tenderness: There is no abdominal tenderness. There is no guarding.  Musculoskeletal:        General: Normal range of motion.     Cervical back: Normal range of motion and neck supple.   Skin:    General: Skin is warm and dry.  Neurological:     General: No focal deficit present.     Mental Status: He is oriented to  person, place, and time.  Psychiatric:        Mood and Affect: Mood normal.        Thought Content: Thought content normal.        Judgment: Judgment normal.      UC Treatments / Results  Labs (all labs ordered are listed, but only abnormal results are displayed) Labs Reviewed  SARS CORONAVIRUS 2 (TAT 6-24 HRS)    EKG   Radiology No results found.  Procedures Procedures (including critical care time)  Medications Ordered in UC Medications - No data to display  Initial Impression / Assessment and Plan / UC Course  I have reviewed the triage vital signs and the nursing notes.  Pertinent labs & imaging results that were available during my care of the patient were reviewed by me and considered in my medical decision making (see chart for details).     Viral versus allergic cause.  Will test for COVID for rule out and start Zyrtec and Flonase regimen in case allergic.  Continue over-the-counter supportive medications and home remedies.  Isolation reviewed, work note given.  Return for acutely worsening symptoms in the meantime.  Final Clinical Impressions(s) / UC Diagnoses   Final diagnoses:  Viral URI with cough   Discharge Instructions   None    ED Prescriptions    Medication Sig Dispense Auth. Provider   cetirizine (ZYRTEC ALLERGY) 10 MG tablet Take 1 tablet (10 mg total) by mouth daily. 30 tablet Particia Nearing, PA-C   fluticasone [facility]) 50 MCG/ACT nasal spray Place 1 spray into both nostrils in the morning and at bedtime. 16 g Particia Nearing, [place]     PDMP not reviewed this encounter.   Particia Nearing, [place] 01/04/21 1231

## 2021-01-04 NOTE — ED Triage Notes (Signed)
Pt reports nasal congestions, headache and cough in the morning x 2-3 days.   Requested COVID test.

## 2021-01-05 LAB — SARS CORONAVIRUS 2 (TAT 6-24 HRS): SARS Coronavirus 2: NEGATIVE

## 2021-01-29 IMAGING — DX DG KNEE COMPLETE 4+V*R*
4 series · 4 of 4 positions shown · non-contrast
Comparison: Right knee radiograph dated 05/19/2015.

CLINICAL DATA: 53-year-old male with right knee pain. No known
injury.

EXAM:
RIGHT KNEE - COMPLETE 4+ VIEW

[knee ap]
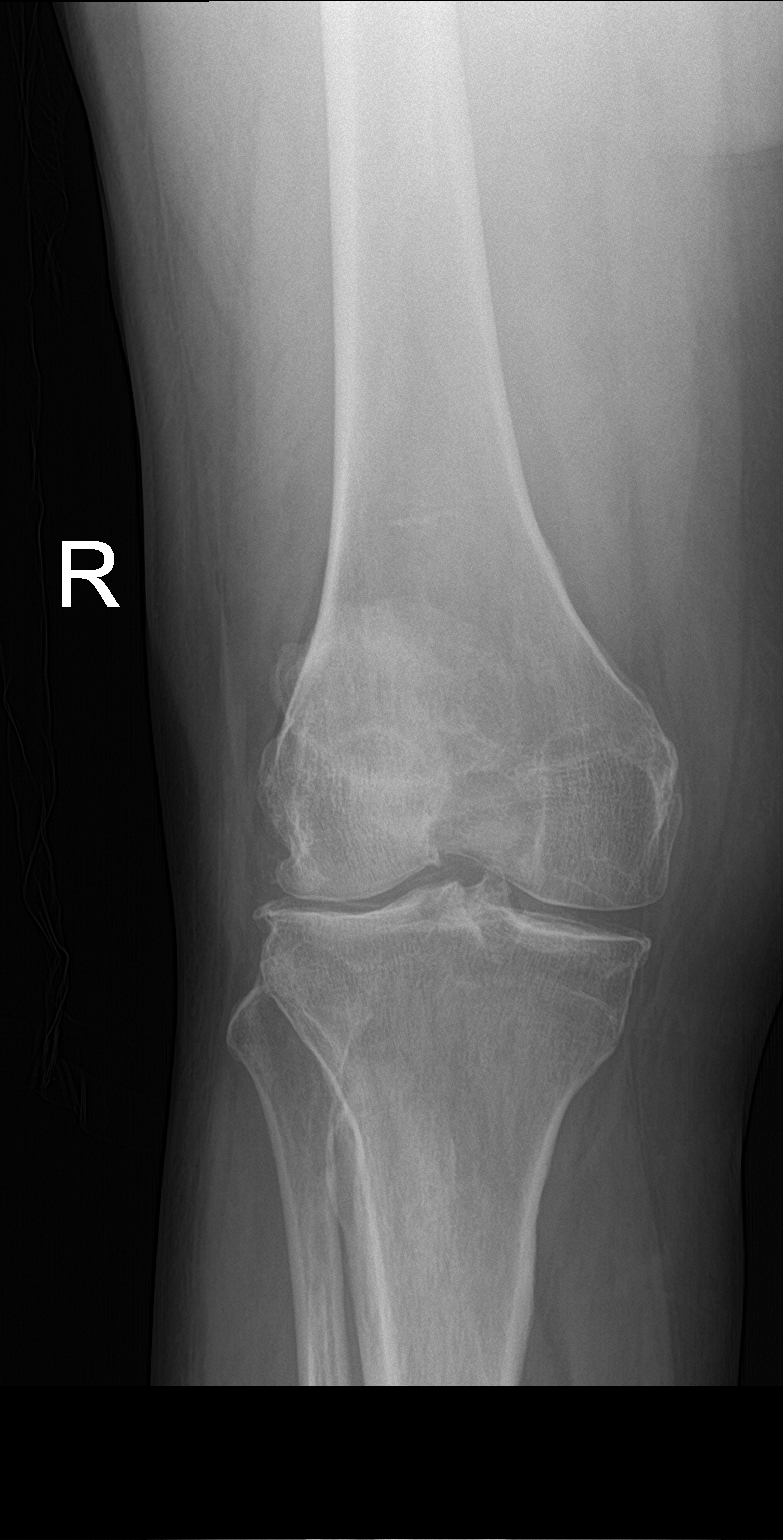

[knee lat]
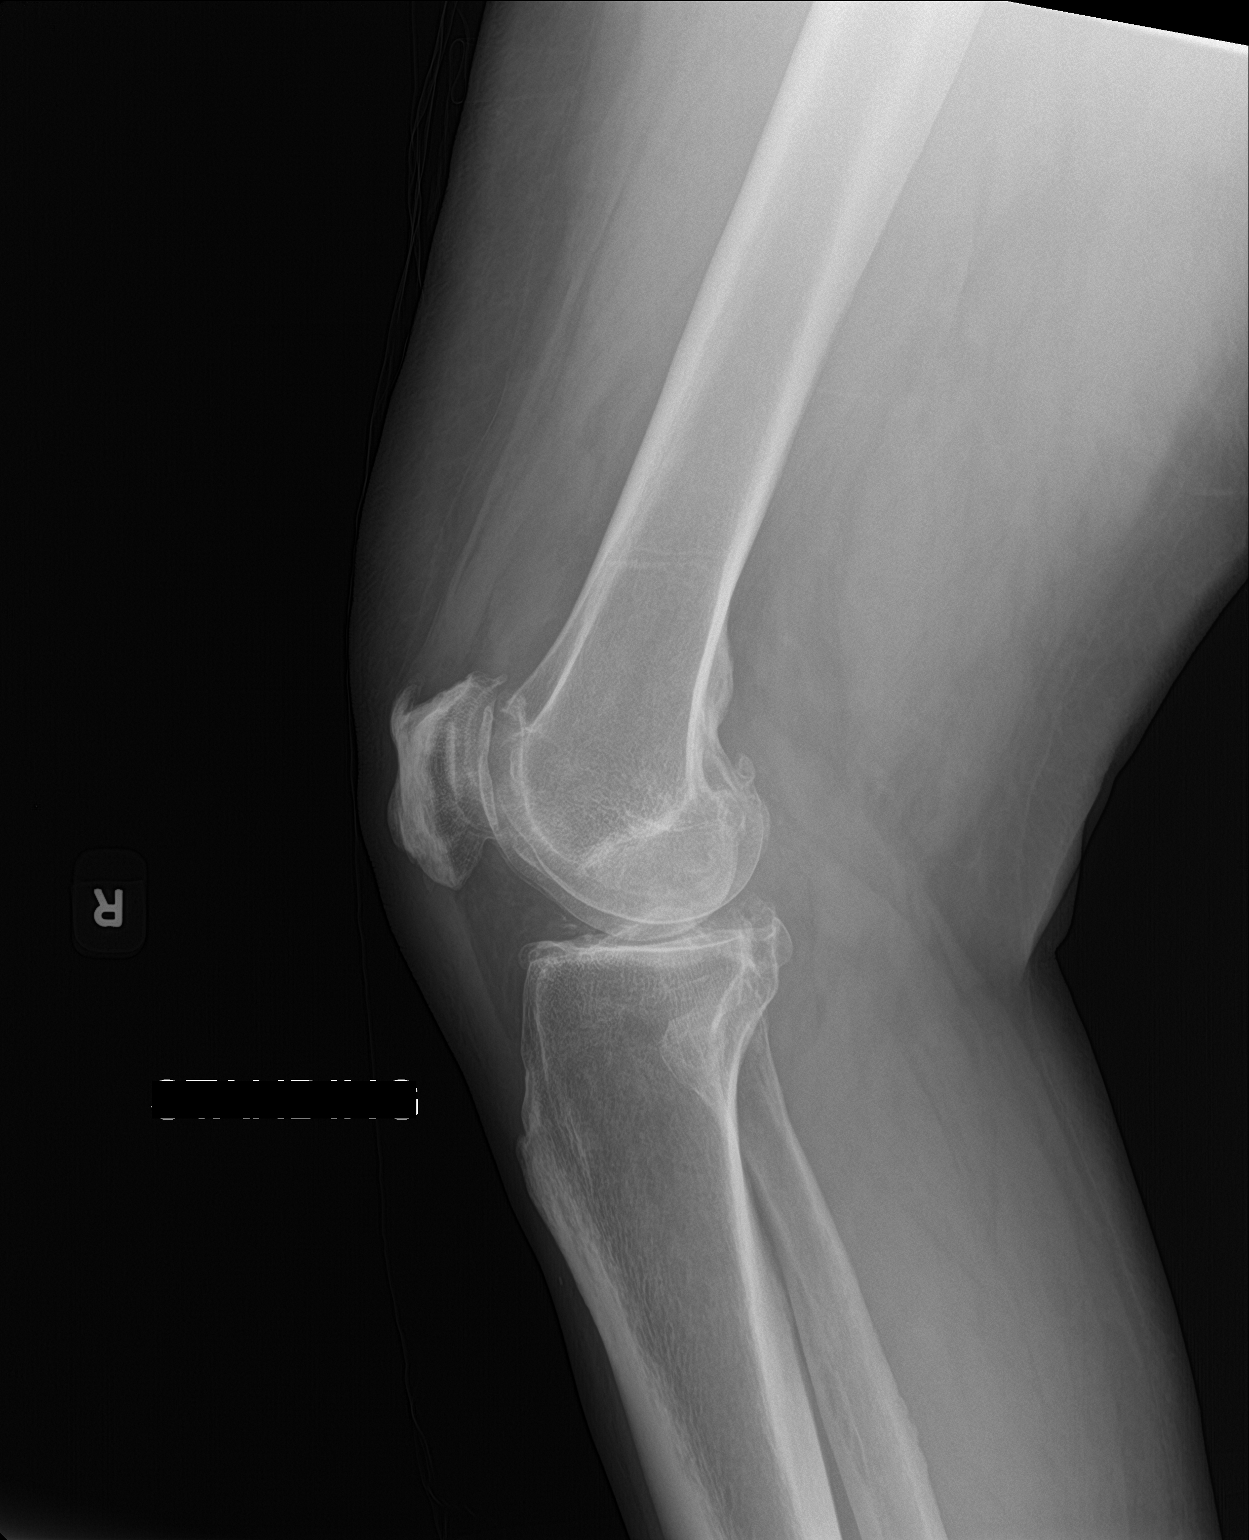

[knee [person_name]]
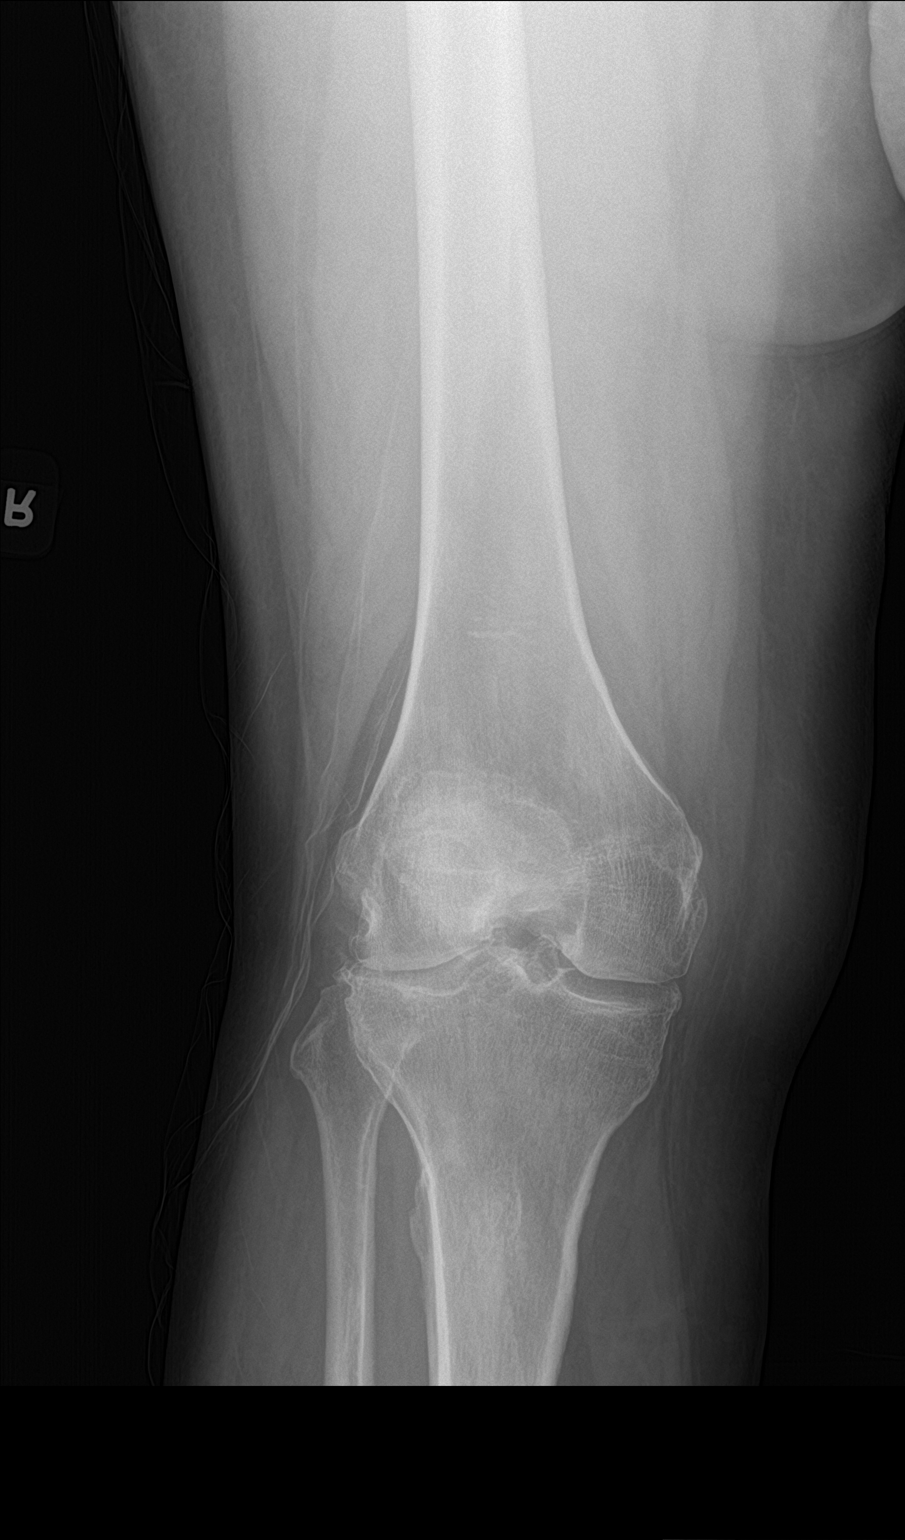

[knee sunrise]
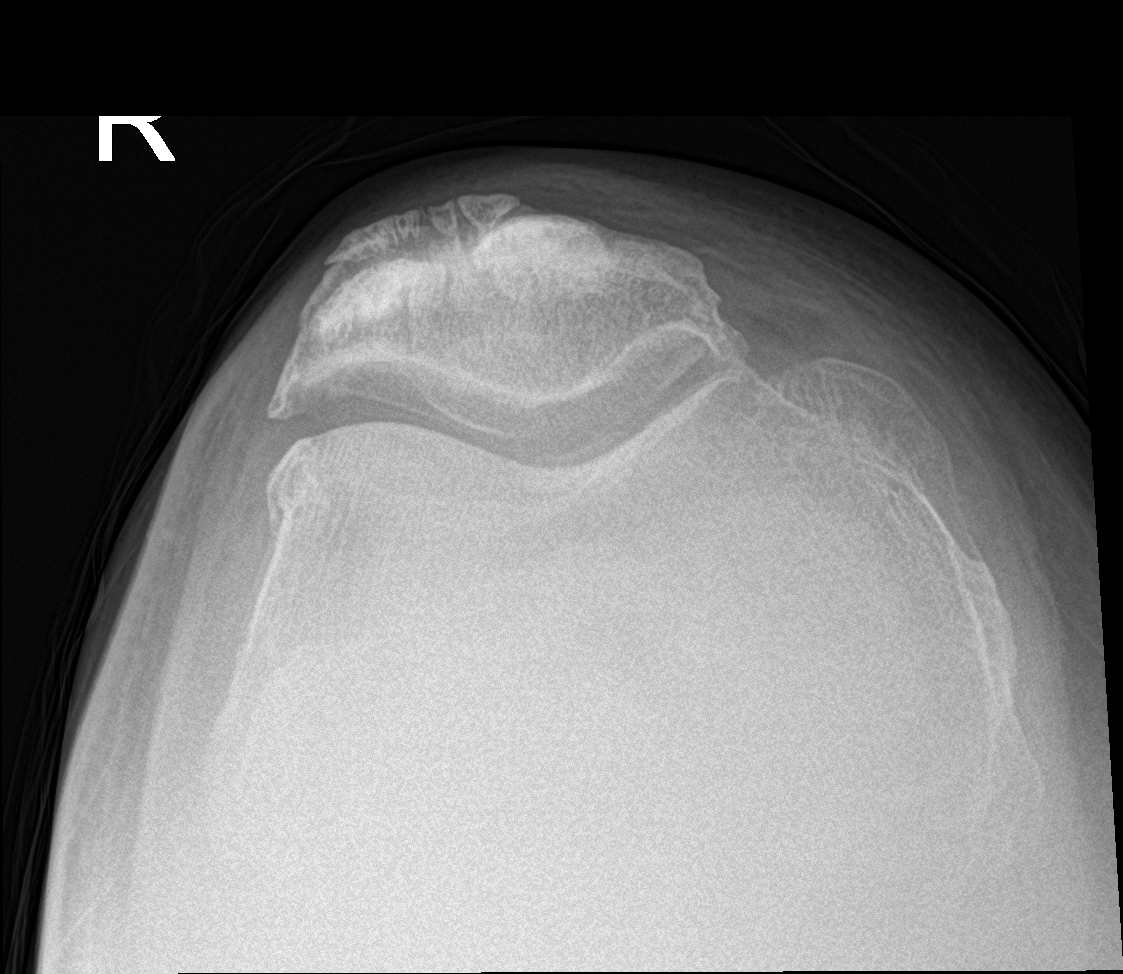

[4 of 4 positions shown; findings below may reference images not displayed]

FINDINGS: There is no acute fracture or dislocation. Moderate to severe
arthritic changes of the knee with tricompartmental narrowing most
prominent at the lateral compartment. There is bone spurring. There
is a small suprapatellar effusion. The soft tissues are
unremarkable.
IMPRESSION: 1. No acute fracture or dislocation.
2. Moderate to severe arthritic changes of the knee.

## 2021-03-28 ENCOUNTER — Other Ambulatory Visit: Payer: Self-pay | Admitting: Internal Medicine

## 2021-03-29 LAB — CBC
HCT: 54 % — ABNORMAL HIGH (ref 38.5–50.0)
Hemoglobin: 18.9 g/dL — ABNORMAL HIGH (ref 13.2–17.1)
MCH: 31.7 pg (ref 27.0–33.0)
MCHC: 35 g/dL (ref 32.0–36.0)
MCV: 90.5 fL (ref 80.0–100.0)
MPV: 10 fL (ref 7.5–12.5)
Platelets: 203 10*3/uL (ref 140–400)
RBC: 5.97 10*6/uL — ABNORMAL HIGH (ref 4.20–5.80)
RDW: 12.5 % (ref 11.0–15.0)
WBC: 7.7 10*3/uL (ref 3.8–10.8)

## 2021-03-29 LAB — COMPLETE METABOLIC PANEL WITH GFR
AG Ratio: 1.3 (calc) (ref 1.0–2.5)
ALT: 34 U/L (ref 9–46)
AST: 24 U/L (ref 10–35)
Albumin: 4.4 g/dL (ref 3.6–5.1)
Alkaline phosphatase (APISO): 76 U/L (ref 35–144)
BUN: 12 mg/dL (ref 7–25)
CO2: 21 mmol/L (ref 20–32)
Calcium: 9.8 mg/dL (ref 8.6–10.3)
Chloride: 99 mmol/L (ref 98–110)
Creat: 0.8 mg/dL (ref 0.70–1.30)
Globulin: 3.3 g/dL (calc) (ref 1.9–3.7)
Glucose, Bld: 107 mg/dL — ABNORMAL HIGH (ref 65–99)
Potassium: 3.5 mmol/L (ref 3.5–5.3)
Sodium: 134 mmol/L — ABNORMAL LOW (ref 135–146)
Total Bilirubin: 1.1 mg/dL (ref 0.2–1.2)
Total Protein: 7.7 g/dL (ref 6.1–8.1)
eGFR: 103 mL/min/{1.73_m2} (ref >=60)

## 2021-03-29 LAB — VITAMIN D 25 HYDROXY (VIT D DEFICIENCY, FRACTURES): Vit D, 25-Hydroxy: 60 ng/mL (ref 30–100)

## 2021-03-29 LAB — LIPID PANEL
Cholesterol: 230 mg/dL — ABNORMAL HIGH (ref <200)
HDL: 66 mg/dL (ref >=40)
LDL Cholesterol (Calc): 133 mg/dL (calc) — ABNORMAL HIGH
Non-HDL Cholesterol (Calc): 164 mg/dL (calc) — ABNORMAL HIGH (ref <130)
Total CHOL/HDL Ratio: 3.5 (calc) (ref <5.0)
Triglycerides: 173 mg/dL — ABNORMAL HIGH (ref <150)

## 2021-03-29 LAB — TSH: TSH: 2.67 mIU/L (ref 0.40–4.50)

## 2021-03-29 LAB — PSA: PSA: 0.23 ng/mL (ref <=4.00)

## 2021-05-01 ENCOUNTER — Encounter (HOSPITAL_COMMUNITY): Payer: Self-pay

## 2021-05-01 ENCOUNTER — Emergency Department (HOSPITAL_COMMUNITY)
Admission: EM | Admit: 2021-05-01 | Discharge: 2021-05-01 | Disposition: A | Discharge status: Home / Self Care | Payer: BC Managed Care – PPO | Attending: Emergency Medicine | Admitting: Emergency Medicine

## 2021-05-01 DIAGNOSIS — R509 Fever, unspecified: Secondary | ICD-10-CM | POA: Diagnosis present

## 2021-05-01 DIAGNOSIS — Z20822 Contact with and (suspected) exposure to covid-19: Secondary | ICD-10-CM | POA: Insufficient documentation

## 2021-05-01 DIAGNOSIS — E119 Type 2 diabetes mellitus without complications: Secondary | ICD-10-CM | POA: Diagnosis not present

## 2021-05-01 DIAGNOSIS — I10 Essential (primary) hypertension: Secondary | ICD-10-CM | POA: Diagnosis not present

## 2021-05-01 DIAGNOSIS — F17290 Nicotine dependence, other tobacco product, uncomplicated: Secondary | ICD-10-CM | POA: Diagnosis not present

## 2021-05-01 LAB — RESP PANEL BY RT-PCR (FLU A&B, COVID) ARPGX2
Influenza A by PCR: NEGATIVE
Influenza B by PCR: NEGATIVE
SARS Coronavirus 2 by RT PCR: NEGATIVE

## 2021-05-01 NOTE — ED Provider Notes (Signed)
Emergency Department Provider Note   I have reviewed the triage vital signs and the nursing notes.   HISTORY  Chief Complaint No chief complaint on file.   HPI Louis Gibson is a 55 y.o. male with past medical history reviewed below presents emergency department with subjective fevers over the past several days.  Patient states he will intermittently feel very hot and go to take his temperature but his temperature is normal.  He denies any associated symptoms such as cough, congestion, headache, vomiting, diarrhea.  No radiation of symptoms or other modifying factors.  No sick contact.  No recent antibiotic.  Patient states that other than occasionally feeling very hot he is feeling overall well.    Past Medical History:  Diagnosis Date   Arthritis    Diabetes mellitus without complication (HCC)    Hypertension     Patient Active Problem List   Diagnosis Date Noted   Chest pain 08/11/2015   Essential hypertension 08/11/2015   Tobacco abuse 08/11/2015   Retrosternal chest pain 08/10/2015    History reviewed. No pertinent surgical history.  Allergies Eggs or egg-derived products  Family History  Problem Relation Age of Onset   Hypertension Brother    CAD Maternal Uncle    Diabetes Mellitus II Maternal Uncle    Kidney disease Brother     Social History Social History   Tobacco Use   Smoking status: Every Day    Types: Cigars   Smokeless tobacco: Never   Tobacco comments:    1 -2 cigars a day.  Substance Use Topics   Alcohol use: Yes    Comment: 2 - 4 beers on the weekend.   Drug use: No    Review of Systems  Constitutional: Positive subjective fever.  Eyes: No visual changes. ENT: No sore throat. Cardiovascular: Denies chest pain. Respiratory: Denies shortness of breath. Gastrointestinal: No abdominal pain.  No nausea, no vomiting.  No diarrhea.  No constipation. Genitourinary: Negative for dysuria. Musculoskeletal: Negative for back  pain. Skin: Negative for rash. Neurological: Negative for headaches, focal weakness or numbness.  10-point ROS otherwise negative.  ____________________________________________   PHYSICAL EXAM:  VITAL SIGNS: ED Triage Vitals  Enc Vitals Group     BP 05/01/21 1649 (!) 173/97     Pulse Rate 05/01/21 1649 99     Resp 05/01/21 1649 14     Temp 05/01/21 1649 98.4 F (36.9 C)     Temp Source 05/01/21 1649 Oral     SpO2 05/01/21 1649 100 %   Constitutional: Alert and oriented. Well appearing and in no acute distress. Eyes: Conjunctivae are normal. Head: Atraumatic. Nose: No congestion/rhinnorhea. Mouth/Throat: Mucous membranes are moist.  Oropharynx non-erythematous. Neck: No stridor.   Cardiovascular: Normal rate, regular rhythm. Good peripheral circulation. Grossly normal heart sounds.   Respiratory: Normal respiratory effort.  No retractions. Lungs CTAB. Gastrointestinal: Soft and nontender. No distention.  Musculoskeletal: No lower extremity tenderness nor edema. No gross deformities of extremities. Neurologic:  Normal speech and language. No gross focal neurologic deficits are appreciated.  Skin:  Skin is warm, dry and intact. No rash noted.   ____________________________________________   LABS (all labs ordered are listed, but only abnormal results are displayed)  Labs Reviewed  RESP PANEL BY RT-PCR (FLU A&B, COVID) [id number]    ____________________________________________   PROCEDURES  Procedure(s) performed:   Procedures  None  ____________________________________________   INITIAL IMPRESSION / ASSESSMENT AND PLAN / ED COURSE  Pertinent labs & imaging results that  were available during my care of the patient were reviewed by me and considered in my medical decision making (see chart for details).   Patient is overall well-appearing here with some subjective fevers.  Vital signs here show some asymptomatic hypertension but no other acute findings. Doubt  SIRS/Sepsis clinically. No clear fever source. Plan for respiratory viral testing and will follow up in the MyChart app.    ____________________________________________  FINAL CLINICAL IMPRESSION(S) / ED DIAGNOSES  Final diagnoses:  Subjective fever     Note:  This document was prepared using Dragon voice recognition software and may include unintentional dictation errors.  Alona Bene, MD, [facility]    Meisha Salone, Arlyss Repress, MD 05/01/21 Mikle Bosworth

## 2021-05-01 NOTE — ED Notes (Signed)
E-signature pad unavailable at time of pt discharge. This RN discussed discharge materials with pt and answered all pt questions. Pt stated understanding of discharge material. RN ensured that pt understood he will be able to review his lab results on MyChart.

## 2021-05-01 NOTE — ED Triage Notes (Signed)
Pt states he's been feeling hot recently (x1wk), states normothermic every time he checks his temp. Denies additional complaints, states he just feels feverish.

## 2021-05-01 NOTE — Discharge Instructions (Signed)
Emergency room today for feeling feverish.  We are sending respiratory viral testing which will come back in the MyChart app later tonight.  Please treat any fever with Tylenol and or Motrin.  If you develop any new or suddenly worsening symptoms he can return to the emergency department otherwise please follow closely with your primary care doctor.

## 2021-06-29 ENCOUNTER — Other Ambulatory Visit: Payer: Self-pay | Admitting: Internal Medicine

## 2021-06-30 LAB — URINE CULTURE
MICRO NUMBER:: 12615193
Result:: NO GROWTH
SPECIMEN QUALITY:: ADEQUATE

## 2021-08-17 ENCOUNTER — Other Ambulatory Visit: Payer: Self-pay

## 2021-08-17 ENCOUNTER — Inpatient Hospital Stay (HOSPITAL_COMMUNITY)
Admission: EM | Admit: 2021-08-17 | Discharge: 2021-08-20 | DRG: 379 | Disposition: A | Discharge status: Home / Self Care | Payer: BC Managed Care – PPO | Attending: Internal Medicine | Admitting: Internal Medicine

## 2021-08-17 ENCOUNTER — Encounter (HOSPITAL_COMMUNITY): Payer: Self-pay | Admitting: Emergency Medicine

## 2021-08-17 DIAGNOSIS — K760 Fatty (change of) liver, not elsewhere classified: Secondary | ICD-10-CM | POA: Diagnosis present

## 2021-08-17 DIAGNOSIS — Z79899 Other long term (current) drug therapy: Secondary | ICD-10-CM

## 2021-08-17 DIAGNOSIS — Z720 Tobacco use: Secondary | ICD-10-CM | POA: Diagnosis present

## 2021-08-17 DIAGNOSIS — K254 Chronic or unspecified gastric ulcer with hemorrhage: Principal | ICD-10-CM | POA: Diagnosis present

## 2021-08-17 DIAGNOSIS — Z20822 Contact with and (suspected) exposure to covid-19: Secondary | ICD-10-CM | POA: Diagnosis present

## 2021-08-17 DIAGNOSIS — E669 Obesity, unspecified: Secondary | ICD-10-CM | POA: Diagnosis present

## 2021-08-17 DIAGNOSIS — K5731 Diverticulosis of large intestine without perforation or abscess with bleeding: Secondary | ICD-10-CM | POA: Diagnosis present

## 2021-08-17 DIAGNOSIS — Z794 Long term (current) use of insulin: Secondary | ICD-10-CM

## 2021-08-17 DIAGNOSIS — F17290 Nicotine dependence, other tobacco product, uncomplicated: Secondary | ICD-10-CM | POA: Diagnosis present

## 2021-08-17 DIAGNOSIS — E1169 Type 2 diabetes mellitus with other specified complication: Secondary | ICD-10-CM | POA: Diagnosis present

## 2021-08-17 DIAGNOSIS — Z841 Family history of disorders of kidney and ureter: Secondary | ICD-10-CM

## 2021-08-17 DIAGNOSIS — E119 Type 2 diabetes mellitus without complications: Secondary | ICD-10-CM | POA: Diagnosis present

## 2021-08-17 DIAGNOSIS — Z8249 Family history of ischemic heart disease and other diseases of the circulatory system: Secondary | ICD-10-CM

## 2021-08-17 DIAGNOSIS — K635 Polyp of colon: Secondary | ICD-10-CM | POA: Diagnosis present

## 2021-08-17 DIAGNOSIS — Z791 Long term (current) use of non-steroidal anti-inflammatories (NSAID): Secondary | ICD-10-CM

## 2021-08-17 DIAGNOSIS — K7460 Unspecified cirrhosis of liver: Secondary | ICD-10-CM | POA: Diagnosis present

## 2021-08-17 DIAGNOSIS — Z6837 Body mass index (BMI) 37.0-37.9, adult: Secondary | ICD-10-CM

## 2021-08-17 DIAGNOSIS — K922 Gastrointestinal hemorrhage, unspecified: Secondary | ICD-10-CM | POA: Diagnosis not present

## 2021-08-17 DIAGNOSIS — F1010 Alcohol abuse, uncomplicated: Secondary | ICD-10-CM | POA: Diagnosis present

## 2021-08-17 DIAGNOSIS — Z7984 Long term (current) use of oral hypoglycemic drugs: Secondary | ICD-10-CM

## 2021-08-17 DIAGNOSIS — I10 Essential (primary) hypertension: Secondary | ICD-10-CM | POA: Diagnosis present

## 2021-08-17 DIAGNOSIS — Z91012 Allergy to eggs: Secondary | ICD-10-CM

## 2021-08-17 DIAGNOSIS — Z833 Family history of diabetes mellitus: Secondary | ICD-10-CM

## 2021-08-17 LAB — COMPREHENSIVE METABOLIC PANEL
ALT: 30 U/L (ref 0–44)
AST: 25 U/L (ref 15–41)
Albumin: 4.1 g/dL (ref 3.5–5.0)
Alkaline Phosphatase: 62 U/L (ref 38–126)
Anion gap: 11 (ref 5–15)
BUN: 15 mg/dL (ref 6–20)
CO2: 25 mmol/L (ref 22–32)
Calcium: 9.8 mg/dL (ref 8.9–10.3)
Chloride: 101 mmol/L (ref 98–111)
Creatinine, Ser: 0.88 mg/dL (ref 0.61–1.24)
GFR, Estimated: >60 mL/min (ref >60)
Glucose, Bld: 128 mg/dL — ABNORMAL HIGH (ref 70–99)
Potassium: 4.4 mmol/L (ref 3.5–5.1)
Sodium: 137 mmol/L (ref 135–145)
Total Bilirubin: 1.1 mg/dL (ref 0.3–1.2)
Total Protein: 7.1 g/dL (ref 6.5–8.1)

## 2021-08-17 LAB — CBC
HCT: 48 % (ref 39.0–52.0)
Hemoglobin: 16.7 g/dL (ref 13.0–17.0)
MCH: 31.6 pg (ref 26.0–34.0)
MCHC: 34.8 g/dL (ref 30.0–36.0)
MCV: 90.9 fL (ref 80.0–100.0)
Platelets: 214 10*3/uL (ref 150–400)
RBC: 5.28 MIL/uL (ref 4.22–5.81)
RDW: 11.7 % (ref 11.5–15.5)
WBC: 8.6 10*3/uL (ref 4.0–10.5)
nRBC: 0 % (ref 0.0–0.2)

## 2021-08-17 LAB — TYPE AND SCREEN
ABO/RH(D): B POS
Antibody Screen: NEGATIVE

## 2021-08-17 LAB — ABO/RH: ABO/RH(D): B POS

## 2021-08-17 NOTE — ED Triage Notes (Signed)
Pt reports blood in stool x 1 today. Denies abdominal pain, nausea/vomiting.

## 2021-08-18 ENCOUNTER — Encounter (HOSPITAL_COMMUNITY): Payer: Self-pay | Admitting: Radiology

## 2021-08-18 ENCOUNTER — Emergency Department (HOSPITAL_COMMUNITY): Payer: BC Managed Care – PPO

## 2021-08-18 DIAGNOSIS — E1169 Type 2 diabetes mellitus with other specified complication: Secondary | ICD-10-CM

## 2021-08-18 DIAGNOSIS — E669 Obesity, unspecified: Secondary | ICD-10-CM | POA: Diagnosis present

## 2021-08-18 DIAGNOSIS — K760 Fatty (change of) liver, not elsewhere classified: Secondary | ICD-10-CM

## 2021-08-18 DIAGNOSIS — K922 Gastrointestinal hemorrhage, unspecified: Secondary | ICD-10-CM

## 2021-08-18 DIAGNOSIS — F1010 Alcohol abuse, uncomplicated: Secondary | ICD-10-CM | POA: Diagnosis not present

## 2021-08-18 DIAGNOSIS — J441 Chronic obstructive pulmonary disease with (acute) exacerbation: Secondary | ICD-10-CM | POA: Insufficient documentation

## 2021-08-18 DIAGNOSIS — I10 Essential (primary) hypertension: Secondary | ICD-10-CM

## 2021-08-18 DIAGNOSIS — Z720 Tobacco use: Secondary | ICD-10-CM

## 2021-08-18 LAB — GLUCOSE, CAPILLARY
Glucose-Capillary: 116 mg/dL — ABNORMAL HIGH (ref 70–99)
Glucose-Capillary: 131 mg/dL — ABNORMAL HIGH (ref 70–99)

## 2021-08-18 LAB — HEPATITIS PANEL, ACUTE
HCV Ab: NONREACTIVE
Hep A IgM: NONREACTIVE
Hep B C IgM: NONREACTIVE
Hepatitis B Surface Ag: NONREACTIVE

## 2021-08-18 LAB — RESP PANEL BY RT-PCR (FLU A&B, COVID) ARPGX2
Influenza A by PCR: NEGATIVE
Influenza B by PCR: NEGATIVE
SARS Coronavirus 2 by RT PCR: NEGATIVE

## 2021-08-18 LAB — POC OCCULT BLOOD, ED: Fecal Occult Bld: POSITIVE — AB

## 2021-08-18 LAB — HEMOGLOBIN A1C
Hgb A1c MFr Bld: 6.9 % — ABNORMAL HIGH (ref 4.8–5.6)
Mean Plasma Glucose: 151.33 mg/dL

## 2021-08-18 LAB — HEMOGLOBIN AND HEMATOCRIT, BLOOD
HCT: 44.4 % (ref 39.0–52.0)
Hemoglobin: 15.3 g/dL (ref 13.0–17.0)

## 2021-08-18 LAB — CBG MONITORING, ED: Glucose-Capillary: 136 mg/dL — ABNORMAL HIGH (ref 70–99)

## 2021-08-18 LAB — HIV ANTIBODY (ROUTINE TESTING W REFLEX): HIV Screen 4th Generation wRfx: NONREACTIVE

## 2021-08-18 MED ORDER — AMLODIPINE BESYLATE 10 MG PO TABS
10.0000 mg | ORAL_TABLET | Freq: Every day | ORAL | Status: DC
  Administered 2021-08-18 – 2021-08-20 (×3): 10 mg via ORAL
  Filled 2021-08-18: qty 1
  Filled 2021-08-18: qty 2
  Filled 2021-08-18: qty 1

## 2021-08-18 MED ORDER — ADULT MULTIVITAMIN W/MINERALS CH
1.0000 | ORAL_TABLET | Freq: Every day | ORAL | Status: DC
  Administered 2021-08-18 – 2021-08-20 (×3): 1 via ORAL
  Filled 2021-08-18 (×3): qty 1

## 2021-08-18 MED ORDER — ALBUTEROL SULFATE (2.5 MG/3ML) 0.083% IN NEBU: 2.5000 mg | INHALATION_SOLUTION | Freq: Four times a day (QID) | RESPIRATORY_TRACT | Status: DC | PRN

## 2021-08-18 MED ORDER — SODIUM CHLORIDE 0.9 % IV SOLN: INTRAVENOUS | Status: DC

## 2021-08-18 MED ORDER — HYDROCHLOROTHIAZIDE 25 MG PO TABS
12.5000 mg | ORAL_TABLET | Freq: Every day | ORAL | Status: DC
  Administered 2021-08-18 – 2021-08-20 (×3): 12.5 mg via ORAL
  Filled 2021-08-18 (×3): qty 1

## 2021-08-18 MED ORDER — PEG 3350-KCL-NA BICARB-NACL 420 G PO SOLR
4000.0000 mL | Freq: Once | ORAL | Status: AC
  Administered 2021-08-18: 4000 mL via ORAL
  Filled 2021-08-18: qty 4000

## 2021-08-18 MED ORDER — IOHEXOL 300 MG/ML  SOLN
100.0000 mL | Freq: Once | INTRAMUSCULAR | Status: AC | PRN
  Administered 2021-08-18: 09:00:00 100 mL via INTRAVENOUS

## 2021-08-18 MED ORDER — ONDANSETRON HCL 4 MG PO TABS: 4.0000 mg | ORAL_TABLET | Freq: Four times a day (QID) | ORAL | Status: DC | PRN

## 2021-08-18 MED ORDER — THIAMINE HCL 100 MG/ML IJ SOLN: 100.0000 mg | Freq: Every day | INTRAMUSCULAR | Status: DC

## 2021-08-18 MED ORDER — PANTOPRAZOLE SODIUM 40 MG IV SOLR
40.0000 mg | Freq: Two times a day (BID) | INTRAVENOUS | Status: DC
  Administered 2021-08-18 – 2021-08-20 (×5): 40 mg via INTRAVENOUS
  Filled 2021-08-18 (×5): qty 40

## 2021-08-18 MED ORDER — ONDANSETRON HCL 4 MG/2ML IJ SOLN: 4.0000 mg | Freq: Four times a day (QID) | INTRAMUSCULAR | Status: DC | PRN

## 2021-08-18 MED ORDER — LORAZEPAM 2 MG/ML IJ SOLN: 1.0000 mg | INTRAMUSCULAR | Status: DC | PRN

## 2021-08-18 MED ORDER — FOLIC ACID 1 MG PO TABS
1.0000 mg | ORAL_TABLET | Freq: Every day | ORAL | Status: DC
  Administered 2021-08-18 – 2021-08-20 (×3): 1 mg via ORAL
  Filled 2021-08-18 (×3): qty 1

## 2021-08-18 MED ORDER — SODIUM CHLORIDE 0.9% FLUSH
3.0000 mL | Freq: Two times a day (BID) | INTRAVENOUS | Status: DC
  Administered 2021-08-18: 11:00:00 3 mL via INTRAVENOUS

## 2021-08-18 MED ORDER — INSULIN ASPART 100 UNIT/ML IJ SOLN
0.0000 [IU] | Freq: Three times a day (TID) | INTRAMUSCULAR | Status: DC
  Administered 2021-08-18 – 2021-08-20 (×3): 1 [IU] via SUBCUTANEOUS

## 2021-08-18 MED ORDER — ACETAMINOPHEN 650 MG RE SUPP: 650.0000 mg | Freq: Four times a day (QID) | RECTAL | Status: DC | PRN

## 2021-08-18 MED ORDER — ACETAMINOPHEN 325 MG PO TABS
650.0000 mg | ORAL_TABLET | Freq: Four times a day (QID) | ORAL | Status: DC | PRN
  Administered 2021-08-20: 650 mg via ORAL
  Filled 2021-08-18: qty 2

## 2021-08-18 MED ORDER — THIAMINE HCL 100 MG PO TABS
100.0000 mg | ORAL_TABLET | Freq: Every day | ORAL | Status: DC
  Administered 2021-08-18 – 2021-08-20 (×3): 100 mg via ORAL
  Filled 2021-08-18 (×3): qty 1

## 2021-08-18 MED ORDER — LORAZEPAM 1 MG PO TABS
1.0000 mg | ORAL_TABLET | ORAL | Status: DC | PRN
  Administered 2021-08-20: 1 mg via ORAL
  Filled 2021-08-18: qty 1

## 2021-08-18 NOTE — H&P (View-Only) (Signed)
Reason for Consult: Hematochezia and cirrhosis Referring Physician: [facility]  Louis Gibson HPI: This is a 55 year old male with PMH of HTN, DM, diverticulosis, and ETOH abuse admitted for complaints of hematochezia.  His bleeding started at home yesterday afternoon around lunch time.  He never experienced this type of bleeding and it concerned him enough to present to the ER.  Over the course of the day and evening he did have some further bleeding.  The last bout of bleeding was earlier this morning.  The bleeding varied in color from dark blood to bright red blood and they were painless.  He was identified to have pan-diverticulosis with his screening colonoscopy on 08/31/2015.  Lately he does report using using some Goody powder for arthritis, in addition to naproxyn.  A CT scan of the abdomen does show that he has early cirrhosis.  On a daily basis he drinks a significant amount of ETOH.  He states that the same amount as that found in a liquor store.  Past Medical History:  Diagnosis Date   Arthritis    Diabetes mellitus without complication (HCC)    Hypertension     No past surgical history on file.  Family History  Problem Relation Age of Onset   Hypertension Brother    CAD Maternal Uncle    Diabetes Mellitus II Maternal Uncle    Kidney disease Brother     Social History:  reports that he has been smoking cigars. He has never used smokeless tobacco. He reports current alcohol use. He reports that he does not use drugs.  Allergies:  Allergies  Allergen Reactions   Eggs Or Egg-Derived Products Nausea And Vomiting    Medications: Scheduled:  amLODipine  10 mg Oral Daily   folic acid  1 mg Oral Daily   hydrochlorothiazide  12.5 mg Oral Daily   insulin aspart  0-9 Units Subcutaneous TID WC   multivitamin with minerals  1 tablet Oral Daily   pantoprazole (PROTONIX) IV  40 mg Intravenous Q12H   polyethylene glycol-electrolytes  4,000 mL Oral Once   sodium chloride  flush  3 mL Intravenous Q12H   thiamine  100 mg Oral Daily   Or   thiamine  100 mg Intravenous Daily   Continuous:  sodium chloride 75 mL/hr at 08/18/21 1034    Results for orders placed or performed during the hospital encounter of 08/17/21 (from the past 24 hour(s))  Type and screen  [facility]     Status: None   Collection Time: 08/17/21  7:15 PM  Result Value Ref Range   ABO/RH(D) B POS    Antibody Screen NEG    Sample Expiration      08/20/2021,2359 Performed at [facility], [address], [place], [place] [address]   Comprehensive metabolic panel     Status: Abnormal   Collection Time: 08/17/21  7:23 PM  Result Value Ref Range   Sodium 137 135 - 145 mmol/L   Potassium 4.4 3.5 - 5.1 mmol/L   Chloride 101 98 - 111 mmol/L   CO2 25 22 - 32 mmol/L   Glucose, Bld 128 (H) 70 - 99 mg/dL   BUN 15 6 - 20 mg/dL   Creatinine, Ser 2.35 0.61 - 1.24 mg/dL   Calcium 9.8 8.9 - 36.1 mg/dL   Total Protein 7.1 6.5 - 8.1 g/dL   Albumin 4.1 3.5 - 5.0 g/dL   AST 25 15 - 41 U/L   ALT 30  0 - 44 U/L   Alkaline Phosphatase 62 38 - 126 U/L   Total Bilirubin 1.1 0.3 - 1.2 mg/dL   GFR, Estimated >60 >60 mL/min   Anion gap 11 5 - 15  CBC     Status: None   Collection Time: 08/17/21  7:23 PM  Result Value Ref Range   WBC 8.6 4.0 - 10.5 K/uL   RBC 5.28 4.22 - 5.81 MIL/uL   Hemoglobin 16.7 13.0 - 17.0 g/dL   HCT 48.0 39.0 - 52.0 %   MCV 90.9 80.0 - 100.0 fL   MCH 31.6 26.0 - 34.0 pg   MCHC 34.8 30.0 - 36.0 g/dL   RDW 11.7 11.5 - 15.5 %   Platelets 214 150 - 400 K/uL   nRBC 0.0 0.0 - 0.2 %  ABO/Rh     Status: None   Collection Time: 08/17/21  7:25 PM  Result Value Ref Range   ABO/RH(D)      B POS Performed at [facility], [place] [address], [place], [place] [address]   POC occult blood, ED     Status: Abnormal   Collection Time: 08/18/21  7:20 AM  Result Value Ref Range   Fecal Occult Bld POSITIVE (A) NEGATIVE  Hemoglobin and hematocrit, blood     Status:  None   Collection Time: 08/18/21  9:30 AM  Result Value Ref Range   Hemoglobin 15.3 13.0 - 17.0 g/dL   HCT 44.4 39.0 - 52.0 %  Resp Panel by RT-PCR (Flu A&B, Covid) Nasopharyngeal Swab     Status: None   Collection Time: 08/18/21 10:05 AM   Specimen: Nasopharyngeal Swab; Nasopharyngeal(NP) swabs in vial transport medium  Result Value Ref Range   SARS Coronavirus 2 by RT PCR NEGATIVE NEGATIVE   Influenza A by PCR NEGATIVE NEGATIVE   Influenza B by PCR NEGATIVE NEGATIVE  CBG monitoring, ED     Status: Abnormal   Collection Time: 08/18/21 12:24 PM  Result Value Ref Range   Glucose-Capillary 136 (H) 70 - 99 mg/dL     CT ABDOMEN PELVIS W CONTRAST  Result Date: 08/18/2021 CLINICAL DATA:  Diffuse abdominal pain and bloody stools which began yesterday. EXAM: CT ABDOMEN AND PELVIS WITH CONTRAST TECHNIQUE: Multidetector CT imaging of the abdomen and pelvis was performed using the standard protocol following bolus administration of intravenous contrast. CONTRAST:  123mL OMNIPAQUE IOHEXOL 300 MG/ML  SOLN COMPARISON:  None. FINDINGS: Lower chest: No acute abnormality. Hepatobiliary: Diffuse hepatic steatosis. Hypertrophy of the lateral segment of left hepatic lobe is noted. Inferior margin of the right hepatic lobe appears slightly irregular and nodular. Gallbladder appears normal. No bile duct dilatation. Pancreas: Unremarkable. No pancreatic ductal dilatation or surrounding inflammatory changes. Spleen: Normal in size without focal abnormality. Adrenals/Urinary Tract: Normal adrenal glands. No nephrolithiasis, hydronephrosis or mass. Urinary bladder is unremarkable. Stomach/Bowel: The stomach appears within normal limits. The appendix is visualized and is normal. No bowel wall thickening, inflammation or distension. Scattered colonic diverticula noted without signs of acute diverticulitis. 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 Normal appearance of the abdominal aorta. No  abdominopelvic adenopathy. Reproductive: Prostate is unremarkable. Other: Small fat containing umbilical hernia. No free fluid or fluid collections within the abdomen or pelvis. Musculoskeletal: No acute or significant osseous findings. Lumbar degenerative disc disease is noted. This is most advanced at L4-5 and L5-S1. IMPRESSION: 1. No acute findings identified within the abdomen or pelvis. 2. Diffuse hepatic steatosis with morphologic features of the liver  suggestive of early cirrhosis. 3. Azygous continuation of the IVC. Electronically Signed   By: Kerby Moors M.D.   On: 08/18/2021 09:07    ROS:  As stated above in the HPI otherwise negative.  Blood pressure 110/66, pulse 68, temperature 98.4 F (36.9 C), temperature source Oral, resp. rate 15, SpO2 96 %.    PE: Gen: NAD, Alert and Oriented HEENT:  [place]/AT, EOMI Neck: Supple, no LAD Lungs: CTA Bilaterally CV: RRR without M/G/R ABD: Soft, NTND, +BS Ext: No C/C/E  Assessment/Plan: 1) Probable diverticular bleed. 2) New finding of early cirrhosis. 3) ETOH abuse.   His clinical presentation is consistent with a diverticular bleed.  There is no significant drop in his HGB and he is hemodynamically stable.  Further evaluation with a repeat colonoscopy will be performed.  Also, he will have an EGD to screen for esophageal varices.  Plan: 1) EGD/colonoscopy tomorrow. 2) Follow HGB and transfuse as necessary.  Delila Kuklinski D 08/18/2021, 4:44 PM

## 2021-08-18 NOTE — ED Notes (Signed)
BRB noted from rectum when PA obtained hemoccult specimen. RN placed pt on monitoring equipment. IV initiated for CT. Pt GCS 15, no distress noted, call light in reach.

## 2021-08-18 NOTE — Plan of Care (Signed)
  Problem: Education: Goal: Ability to identify signs and symptoms of gastrointestinal bleeding will improve Outcome: Progressing   

## 2021-08-18 NOTE — H&P (Signed)
History and Physical    Louis Gibson [id number] DOB: 24-Apr-1966 DOA: 08/17/2021  Referring MD/NP/PA: Kathe Becton, PA-C PCP: Nolene Ebbs, MD  Patient coming from: Home  Chief Complaint: Blood in stools  I have personally briefly reviewed patient's old medical records in [place]   HPI: Louis Gibson is a 55 y.o. male with medical history significant of hypertension, diabetes mellitus type 2, tobacco use, and alcohol abuse who presents with complaints of blood in stools.  Symptoms started yesterday afternoon after he ate lunch.  He initially noted blood in the toilet water after having bowel movement.  Second episode occurred this morning around 2 or 3 AM and reports having a dark black appearing stool with dark blood present.  He denies having any significant abdominal pain, vomiting, chest pain, cough, shortness of breath, lightheadedness, or reported history of easy bruising/bleeding.  He is not on any blood thinners.  However notes that he takes naproxen at least once daily and intermittently uses [facility] Goody powders for arthritis pain in his back and knees.  Patient smokes 1 cigar/day on average and drinks about a pint of Bacardi rum/day which he chases with Pepsi zero.  Over the holidays he drank a little more, but states that he last drink on Monday and does not feel like he is withdrawing.  He recalls having a colonoscopy in 5 to 6 years ago at [facility] on [address] and had a negative Cologuard 1 to 2 years ago.  He does not recall anything being noted of concern with his colonoscopy.  ED Course: Upon admission into the emergency department patient was seen to be afebrile with blood pressures 117/78-148/91, and all other vital signs maintained.  Labs from 12/28 were relatively unremarkable with initial hemoglobin 16.7.  ED provider visualized significant amounts of bright red blood around the perianal region with no findings of internal hemorrhoids.   Repeat hemoglobin and hematocrit pending.  TRH called to admit for observation due to ongoing bleeding.  Review of Systems  Constitutional:  Negative for fever.  HENT:  Negative for congestion and nosebleeds.   Eyes:  Negative for photophobia and pain.  Respiratory:  Negative for cough and shortness of breath.   Cardiovascular:  Negative for chest pain and leg swelling.  Gastrointestinal:  Positive for blood in stool and nausea (During CT scan). Negative for abdominal pain and vomiting.  Genitourinary:  Negative for dysuria and hematuria.  Musculoskeletal:  Positive for back pain and joint pain. Negative for falls and myalgias.  Neurological:  Negative for focal weakness and loss of consciousness.  Psychiatric/Behavioral:  Positive for substance abuse.    Past Medical History:  Diagnosis Date   Arthritis    Diabetes mellitus without complication ([place])    Hypertension     No past surgical history on file.   reports that he has been smoking cigars. He has never used smokeless tobacco. He reports current alcohol use. He reports that he does not use drugs.  Allergies  Allergen Reactions   Eggs Or Egg-Derived Products Nausea And Vomiting    Family History  Problem Relation Age of Onset   Hypertension Brother    CAD Maternal Uncle    Diabetes Mellitus II Maternal Uncle    Kidney disease Brother     Prior to Admission medications   Medication Sig Start Date End Date Taking? Authorizing Provider  amLODipine (NORVASC) 10 MG tablet Take 10 mg by mouth daily. 05/28/21  Yes [provider]  aspirin EC 81 MG tablet Take 81 mg by mouth daily.   Yes [provider]  cetirizine (ZYRTEC ALLERGY) 10 MG tablet Take 1 tablet (10 mg total) by mouth daily. 01/04/21  Yes Volney American, PA-C  cyclobenzaprine (FLEXERIL) 10 MG tablet Take 1 tablet (10 mg total) by mouth 2 (two) times daily as needed for muscle spasms. 06/27/16  Yes Harris, Abigail, PA-C  fluticasone  (FLONASE) 50 MCG/ACT nasal spray Place 1 spray into both nostrils in the morning and at bedtime. 01/04/21  Yes Volney American, PA-C  gabapentin (NEURONTIN) 100 MG capsule Take 100 mg by mouth daily. 04/25/21  Yes [provider]  glimepiride (AMARYL) 4 MG tablet Take 4 mg by mouth daily. 11/21/20  Yes [provider]  hydrochlorothiazide (HYDRODIURIL) 12.5 MG tablet Take 12.5 mg by mouth daily. 11/09/20  Yes [provider]  JANUVIA 100 MG tablet Take 100 mg by mouth daily. 11/21/20  Yes [provider]  Misc Natural Products (GLUCOSAMINE CHONDROITIN COMPLX) TABS Take 1 tablet by mouth 2 (two) times daily.   Yes [provider]  naproxen (NAPROSYN) 500 MG tablet Take 1 tablet (500 mg total) by mouth 2 (two) times daily with a meal. 06/27/16  Yes Harris, Abigail, PA-C  Vitamin D, Ergocalciferol, (DRISDOL) 1.25 MG (50000 UNIT) CAPS capsule Take by mouth. 11/22/20  Yes [provider]  amLODipine (NORVASC) 5 MG tablet Take 5 mg by mouth daily. Patient not taking: Reported on 08/18/2021    [provider]  diclofenac (CATAFLAM) 50 MG tablet Take 1 tablet (50 mg total) by mouth 3 (three) times daily. Patient not taking: Reported on 08/10/2015 05/20/15   Delos Haring, PA-C  diclofenac (VOLTAREN) 75 MG EC tablet Take 1 tablet (75 mg total) by mouth 2 (two) times daily. Patient not taking: Reported on 08/18/2021 08/29/18   Vanessa Kick, MD  Fish Oil OIL Take 1 capsule by mouth 2 (two) times daily. Patient not taking: Reported on 08/18/2021    [provider]  GARLIC PO Take 1 tablet by mouth 2 (two) times daily. Patient not taking: Reported on 08/18/2021    [provider]  HYDROcodone-acetaminophen (NORCO) 5-325 MG tablet Take 1-2 tablets by mouth every 6 (six) hours as needed for moderate pain. Patient not taking: Reported on 08/18/2021 06/27/16   Margarita Mail, PA-C  meloxicam (MOBIC) 15 MG tablet Take 15 mg by mouth daily  as needed for pain.  Patient not taking: Reported on 08/18/2021 07/23/15   [provider]  metFORMIN (GLUCOPHAGE) 500 MG tablet Take 1 tablet (500 mg total) by mouth 2 (two) times daily for 21 days. 10/15/19 11/05/19  Caccavale, Sophia, PA-C  predniSONE (DELTASONE) 20 MG tablet Take 2 tablets daily with breakfast. Patient not taking: Reported on 08/18/2021 12/08/20   Jaynee Eagles, PA-C    Physical Exam:  Constitutional: Middle-age male currently in no acute distress Vitals:   08/18/21 0600 08/18/21 0745 08/18/21 0830 08/18/21 0932  BP: 129/86 (!) 142/89 117/78 (!) 135/93  Pulse: 69 65 62 65  Resp: 14 15 12 15   Temp: 98.2 F (36.8 C) 98 F (36.7 C)    TempSrc: Oral Oral    SpO2: 99% 99% 98% 100%   Eyes: PERRL, lids and conjunctivae normal ENMT: Mucous membranes are moist. Posterior pharynx clear of any exudate or lesions.  Neck: normal, supple, no masses, no thyromegaly Respiratory: clear to auscultation bilaterally, no wheezing, no crackles. Normal respiratory effort. No accessory muscle use.  Cardiovascular:  Regular rate and rhythm, no murmurs / rubs / gallops.  Trace lower extremity edema. 2+ pedal pulses. No carotid bruits.  Abdomen: no tenderness, no masses palpated. No hepatosplenomegaly. Bowel sounds positive.  Musculoskeletal: no clubbing / cyanosis. No joint deformity upper and lower extremities. Good ROM, no contractures. Normal muscle tone.  Skin: no rashes, lesions, ulcers.   Neurologic: CN 2-12 grossly intact. Sensation intact, DTR normal. Strength 5/5 in all 4.  Psychiatric: Normal judgment and insight. Alert and oriented x 3. Normal mood.     Labs on Admission: I have personally reviewed following labs and imaging studies  CBC: Recent Labs  Lab 08/17/21 1923  WBC 8.6  HGB 16.7  HCT 48.0  MCV 90.9  PLT [id number]   Basic Metabolic Panel: Recent Labs  Lab 08/17/21 1923  NA 137  K 4.4  CL 101  CO2 25  GLUCOSE 128*  BUN 15  CREATININE 0.88  CALCIUM 9.8    GFR: CrCl cannot be calculated (Unknown ideal weight.). Liver Function Tests: Recent Labs  Lab 08/17/21 1923  AST 25  ALT 30  ALKPHOS 62  BILITOT 1.1  PROT 7.1  ALBUMIN 4.1   No results for input(s): LIPASE, AMYLASE in the last 168 hours. No results for input(s): AMMONIA in the last 168 hours. Coagulation Profile: No results for input(s): INR, PROTIME in the last 168 hours. Cardiac Enzymes: No results for input(s): CKTOTAL, CKMB, CKMBINDEX, TROPONINI in the last 168 hours. BNP (last 3 results) No results for input(s): PROBNP in the last 8760 hours. HbA1C: No results for input(s): HGBA1C in the last 72 hours. CBG: No results for input(s): GLUCAP in the last 168 hours. Lipid Profile: No results for input(s): CHOL, HDL, LDLCALC, TRIG, CHOLHDL, LDLDIRECT in the last 72 hours. Thyroid Function Tests: No results for input(s): TSH, T4TOTAL, FREET4, T3FREE, THYROIDAB in the last 72 hours. Anemia Panel: No results for input(s): VITAMINB12, FOLATE, FERRITIN, TIBC, IRON, RETICCTPCT in the last 72 hours. Urine analysis: No results found for: COLORURINE, APPEARANCEUR, LABSPEC, PHURINE, GLUCOSEU, HGBUR, BILIRUBINUR, KETONESUR, PROTEINUR, UROBILINOGEN, NITRITE, LEUKOCYTESUR Sepsis Labs: No results found for this or any previous visit (from the past 240 hour(s)).   Radiological Exams on Admission: CT ABDOMEN PELVIS W CONTRAST  Result Date: 08/18/2021 CLINICAL DATA:  Diffuse abdominal pain and bloody stools which began yesterday. EXAM: CT ABDOMEN AND PELVIS WITH CONTRAST TECHNIQUE: Multidetector CT imaging of the abdomen and pelvis was performed using the standard protocol following bolus administration of intravenous contrast. CONTRAST:  166mL OMNIPAQUE IOHEXOL 300 MG/ML  SOLN COMPARISON:  None. FINDINGS: Lower chest: No acute abnormality. Hepatobiliary: Diffuse hepatic steatosis. Hypertrophy of the lateral segment of left hepatic lobe is noted. Inferior margin of the right hepatic lobe  appears slightly irregular and nodular. Gallbladder appears normal. No bile duct dilatation. Pancreas: Unremarkable. No pancreatic ductal dilatation or surrounding inflammatory changes. Spleen: Normal in size without focal abnormality. Adrenals/Urinary Tract: Normal adrenal glands. No nephrolithiasis, hydronephrosis or mass. Urinary bladder is unremarkable. Stomach/Bowel: The stomach appears within normal limits. The appendix is visualized and is normal. No bowel wall thickening, inflammation or distension. Scattered colonic diverticula noted without signs of acute diverticulitis. 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 Normal appearance of the abdominal aorta. No abdominopelvic adenopathy. Reproductive: Prostate is unremarkable. Other: Small fat containing umbilical hernia. No free fluid or fluid collections within the abdomen or pelvis. Musculoskeletal: No acute or significant osseous findings. Lumbar degenerative disc disease is noted. This is most advanced at L4-5 and  L5-S1. IMPRESSION: 1. No acute findings identified within the abdomen or pelvis. 2. Diffuse hepatic steatosis with morphologic features of the liver suggestive of early cirrhosis. 3. Azygous continuation of the IVC. Electronically Signed   By: Signa Kell M.D.   On: 08/18/2021 09:07    EKG: Independently reviewed.  Sinus rhythm at 69 bpm without  Assessment/Plan GI bleed: Acute.  Patient presents with complaints of blood in stools since yesterday.  Stools noted to be black in color.  Noted to be on NSAIDs of naproxen with intermittent use of ibuprofen be scheduled for.  On exam patient noted to have bright red blood per rectum.  Initial hemoglobin was 16.7 g/dL.  Repeat hemoglobin pending.  Reports colonoscopy possibly at [facility] in the last 5 to 6 years and Cologuard test in the last 1 to 2 years.  Suspect possible brisk upper GI bleed related with NSAID use versus diverticular lower GI  bleed. -Admit to medical telemetry bed -Monitor intake and output -Clear liquid diet f -Serial monitoring of H&H -Normal saline IV fluids at 75 mL/h -Protonix 40 mg IV twice daily -Consulted [facility]  Essential hypertension: Blood pressures currently stable.  Home medication regimen includes amlodipine 10 mg daily and hydrochlorothiazide 12.5 mg daily. -Continue current home regimen  Diabetes mellitus type 2 in obese: Home insulin regimen includes Amaryl 4 mg daily and Januvia 100 mg daily.  He reports that metformin was stopped sometime last year due to side effects. -Hypoglycemic protocols -Add on hemoglobin A1c -Hold home oral regimen -CBGs before every meal with sensitive SSI  Hepatic steatosis: Patient noted to have diffuse hepatic steatosis on CT imaging of abdomen.  Liver enzymes noted to be within normal limits.  Possibly related with patient's history of alcohol abuse and/or patient's weight. -Add on hepatitis panel  Alcohol abuse: Patient reports drinking a pint of Bacardi rum per day on average with last drink reported 3 days ago.  No signs of alcohol withdrawal on physical exam. -CIWA protocols -Counseled patient on need of cessation of tobacco use  Tobacco abuse: Patient reports smoking a cigar a day on average. -Continue to counsel on need of cessation of tobacco use  DVT prophylaxis: SCDs Code Status: Full Family Communication: Patient stated that he would update his family Disposition Plan: home Consults called: GI Admission status: Observation  Clydie Braun MD [facility]   If 7PM-7AM, please contact night-coverage   08/18/2021, 9:40 AM

## 2021-08-18 NOTE — Consult Note (Signed)
Reason for Consult: Hematochezia and cirrhosis Referring Physician: [facility]  Marjie Skiff HPI: This is a 55 year old male with PMH of HTN, DM, diverticulosis, and ETOH abuse admitted for complaints of hematochezia.  His bleeding started at home yesterday afternoon around lunch time.  He never experienced this type of bleeding and it concerned him enough to present to the ER.  Over the course of the day and evening he did have some further bleeding.  The last bout of bleeding was earlier this morning.  The bleeding varied in color from dark blood to bright red blood and they were painless.  He was identified to have pan-diverticulosis with his screening colonoscopy on 08/31/2015.  Lately he does report using using some Goody powder for arthritis, in addition to naproxyn.  A CT scan of the abdomen does show that he has early cirrhosis.  On a daily basis he drinks a significant amount of ETOH.  He states that the same amount as that found in a liquor store.  Past Medical History:  Diagnosis Date   Arthritis    Diabetes mellitus without complication (HCC)    Hypertension     No past surgical history on file.  Family History  Problem Relation Age of Onset   Hypertension Brother    CAD Maternal Uncle    Diabetes Mellitus II Maternal Uncle    Kidney disease Brother     Social History:  reports that he has been smoking cigars. He has never used smokeless tobacco. He reports current alcohol use. He reports that he does not use drugs.  Allergies:  Allergies  Allergen Reactions   Eggs Or Egg-Derived Products Nausea And Vomiting    Medications: Scheduled:  amLODipine  10 mg Oral Daily   folic acid  1 mg Oral Daily   hydrochlorothiazide  12.5 mg Oral Daily   insulin aspart  0-9 Units Subcutaneous TID WC   multivitamin with minerals  1 tablet Oral Daily   pantoprazole (PROTONIX) IV  40 mg Intravenous Q12H   polyethylene glycol-electrolytes  4,000 mL Oral Once   sodium chloride  flush  3 mL Intravenous Q12H   thiamine  100 mg Oral Daily   Or   thiamine  100 mg Intravenous Daily   Continuous:  sodium chloride 75 mL/hr at 08/18/21 1034    Results for orders placed or performed during the hospital encounter of 08/17/21 (from the past 24 hour(s))  Type and screen [facility]     Status: None   Collection Time: 08/17/21  7:15 PM  Result Value Ref Range   ABO/RH(D) B POS    Antibody Screen NEG    Sample Expiration      08/20/2021,2359 Performed at [facility], [address], [place], [place] [address]   Comprehensive metabolic panel     Status: Abnormal   Collection Time: 08/17/21  7:23 PM  Result Value Ref Range   Sodium 137 135 - 145 mmol/L   Potassium 4.4 3.5 - 5.1 mmol/L   Chloride 101 98 - 111 mmol/L   CO2 25 22 - 32 mmol/L   Glucose, Bld 128 (H) 70 - 99 mg/dL   BUN 15 6 - 20 mg/dL   Creatinine, Ser 2.35 0.61 - 1.24 mg/dL   Calcium 9.8 8.9 - 36.1 mg/dL   Total Protein 7.1 6.5 - 8.1 g/dL   Albumin 4.1 3.5 - 5.0 g/dL   AST 25 15 - 41 U/L   ALT 30  0 - 44 U/L   Alkaline Phosphatase 62 38 - 126 U/L   Total Bilirubin 1.1 0.3 - 1.2 mg/dL   GFR, Estimated >60 >60 mL/min   Anion gap 11 5 - 15  CBC     Status: None   Collection Time: 08/17/21  7:23 PM  Result Value Ref Range   WBC 8.6 4.0 - 10.5 K/uL   RBC 5.28 4.22 - 5.81 MIL/uL   Hemoglobin 16.7 13.0 - 17.0 g/dL   HCT 48.0 39.0 - 52.0 %   MCV 90.9 80.0 - 100.0 fL   MCH 31.6 26.0 - 34.0 pg   MCHC 34.8 30.0 - 36.0 g/dL   RDW 11.7 11.5 - 15.5 %   Platelets 214 150 - 400 K/uL   nRBC 0.0 0.0 - 0.2 %  ABO/Rh     Status: None   Collection Time: 08/17/21  7:25 PM  Result Value Ref Range   ABO/RH(D)      B POS Performed at [facility], [place] [address], [place], [place] [address]   POC occult blood, ED     Status: Abnormal   Collection Time: 08/18/21  7:20 AM  Result Value Ref Range   Fecal Occult Bld POSITIVE (A) NEGATIVE  Hemoglobin and hematocrit, blood     Status:  None   Collection Time: 08/18/21  9:30 AM  Result Value Ref Range   Hemoglobin 15.3 13.0 - 17.0 g/dL   HCT 44.4 39.0 - 52.0 %  Resp Panel by RT-PCR (Flu A&B, Covid) Nasopharyngeal Swab     Status: None   Collection Time: 08/18/21 10:05 AM   Specimen: Nasopharyngeal Swab; Nasopharyngeal(NP) swabs in vial transport medium  Result Value Ref Range   SARS Coronavirus 2 by RT PCR NEGATIVE NEGATIVE   Influenza A by PCR NEGATIVE NEGATIVE   Influenza B by PCR NEGATIVE NEGATIVE  CBG monitoring, ED     Status: Abnormal   Collection Time: 08/18/21 12:24 PM  Result Value Ref Range   Glucose-Capillary 136 (H) 70 - 99 mg/dL     CT ABDOMEN PELVIS W CONTRAST  Result Date: 08/18/2021 CLINICAL DATA:  Diffuse abdominal pain and bloody stools which began yesterday. EXAM: CT ABDOMEN AND PELVIS WITH CONTRAST TECHNIQUE: Multidetector CT imaging of the abdomen and pelvis was performed using the standard protocol following bolus administration of intravenous contrast. CONTRAST:  175mL OMNIPAQUE IOHEXOL 300 MG/ML  SOLN COMPARISON:  None. FINDINGS: Lower chest: No acute abnormality. Hepatobiliary: Diffuse hepatic steatosis. Hypertrophy of the lateral segment of left hepatic lobe is noted. Inferior margin of the right hepatic lobe appears slightly irregular and nodular. Gallbladder appears normal. No bile duct dilatation. Pancreas: Unremarkable. No pancreatic ductal dilatation or surrounding inflammatory changes. Spleen: Normal in size without focal abnormality. Adrenals/Urinary Tract: Normal adrenal glands. No nephrolithiasis, hydronephrosis or mass. Urinary bladder is unremarkable. Stomach/Bowel: The stomach appears within normal limits. The appendix is visualized and is normal. No bowel wall thickening, inflammation or distension. Scattered colonic diverticula noted without signs of acute diverticulitis. 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 Normal appearance of the abdominal aorta. No  abdominopelvic adenopathy. Reproductive: Prostate is unremarkable. Other: Small fat containing umbilical hernia. No free fluid or fluid collections within the abdomen or pelvis. Musculoskeletal: No acute or significant osseous findings. Lumbar degenerative disc disease is noted. This is most advanced at L4-5 and L5-S1. IMPRESSION: 1. No acute findings identified within the abdomen or pelvis. 2. Diffuse hepatic steatosis with morphologic features of the liver  suggestive of early cirrhosis. 3. Azygous continuation of the IVC. Electronically Signed   By: Kerby Moors M.D.   On: 08/18/2021 09:07    ROS:  As stated above in the HPI otherwise negative.  Blood pressure 110/66, pulse 68, temperature 98.4 F (36.9 C), temperature source Oral, resp. rate 15, SpO2 96 %.    PE: Gen: NAD, Alert and Oriented HEENT:  [place]/AT, EOMI Neck: Supple, no LAD Lungs: CTA Bilaterally CV: RRR without M/G/R ABD: Soft, NTND, +BS Ext: No C/C/E  Assessment/Plan: 1) Probable diverticular bleed. 2) New finding of early cirrhosis. 3) ETOH abuse.   His clinical presentation is consistent with a diverticular bleed.  There is no significant drop in his HGB and he is hemodynamically stable.  Further evaluation with a repeat colonoscopy will be performed.  Also, he will have an EGD to screen for esophageal varices.  Plan: 1) EGD/colonoscopy tomorrow. 2) Follow HGB and transfuse as necessary.  Arcola Freshour D 08/18/2021, 4:44 PM

## 2021-08-18 NOTE — Progress Notes (Signed)
New Admission Note:   Arrival Method:  Stretcher  Mental Orientation: alert/oriented x 4  Telemetry: NSR  Assessment: Completed Skin: intact IV: RAC  NSL   Pain: denies   Tubes: none  Safety Measures: Safety Fall Prevention Plan has been given, discussed and signed Admission: Completed [facility] Orientation: Patient has been orientated to the room, unit and staff.  Family:  Orders have been reviewed and implemented. Will continue to monitor the patient. Call light has been placed within reach and bed alarm has been activated.   Katrina Stack, RN

## 2021-08-18 NOTE — Plan of Care (Signed)
  Problem: Education: Goal: Knowledge of General Education information will improve Description: Including pain rating scale, medication(s)/side effects and non-pharmacologic comfort measures Outcome: Progressing   Problem: Health Behavior/Discharge Planning: Goal: Ability to manage health-related needs will improve Outcome: Progressing   Problem: Clinical Measurements: Goal: Ability to maintain clinical measurements within normal limits will improve Outcome: Progressing Goal: Will remain free from infection Outcome: Progressing Goal: Diagnostic test results will improve Outcome: Progressing   Problem: Coping: Goal: Level of anxiety will decrease Outcome: Progressing   

## 2021-08-18 NOTE — ED Provider Notes (Signed)
[facility] EMERGENCY DEPARTMENT Provider Note   CSN: [id number] Arrival date & time: 08/17/21  1548     History Chief Complaint  Patient presents with   GI Bleeding    Louis Gibson is a 55 y.o. male who presents for evaluation of blood in his stool that started yesterday after eating lunch.  Patient states blood started off is bright red and now appears very dark, almost black.  He is not having any abdominal pain or rectal pain, denies bloating, pain during defecation, N/V and fevers.  No alleviating or aggravating factors.  He does note a few chills.  But he denies chest pain, shortness of breath, and weakness.  Patient states he had a colonoscopy 5 to 6 years ago which was normal.  He also did Cologuard 1 to 2 years ago which was also normal.  He has no history of GI disease and reports no history of hemorrhoids.  HPI     Past Medical History:  Diagnosis Date   Arthritis    Diabetes mellitus without complication ([place])    Hypertension     Patient Active Problem List   Diagnosis Date Noted   Chest pain 08/11/2015   Essential hypertension 08/11/2015   Tobacco abuse 08/11/2015   Retrosternal chest pain 08/10/2015    No past surgical history on file.     Family History  Problem Relation Age of Onset   Hypertension Brother    CAD Maternal Uncle    Diabetes Mellitus II Maternal Uncle    Kidney disease Brother     Social History   Tobacco Use   Smoking status: Every Day    Types: Cigars   Smokeless tobacco: Never   Tobacco comments:    1 -2 cigars a day.  Substance Use Topics   Alcohol use: Yes    Comment: 2 - 4 beers on the weekend.   Drug use: No    Home Medications Prior to Admission medications   Medication Sig Start Date End Date Taking? Authorizing Provider  amLODipine (NORVASC) 5 MG tablet Take 5 mg by mouth daily.    [provider]  aspirin EC 81 MG tablet Take 81 mg by mouth daily.    [provider]   cetirizine (ZYRTEC ALLERGY) 10 MG tablet Take 1 tablet (10 mg total) by mouth daily. 01/04/21   Volney American, PA-C  cyclobenzaprine (FLEXERIL) 10 MG tablet Take 1 tablet (10 mg total) by mouth 2 (two) times daily as needed for muscle spasms. 06/27/16   Margarita Mail, PA-C  diclofenac (CATAFLAM) 50 MG tablet Take 1 tablet (50 mg total) by mouth 3 (three) times daily. Patient not taking: No sig reported 05/20/15   Delos Haring, PA-C  diclofenac (VOLTAREN) 75 MG EC tablet Take 1 tablet (75 mg total) by mouth 2 (two) times daily. 08/29/18   Vanessa Kick, MD  Fish Oil OIL Take 1 capsule by mouth 2 (two) times daily.    [provider]  fluticasone (FLONASE) 50 MCG/ACT nasal spray Place 1 spray into both nostrils in the morning and at bedtime. 01/04/21   Volney American, PA-C  GARLIC PO Take 1 tablet by mouth 2 (two) times daily.    [provider]  glimepiride (AMARYL) 4 MG tablet Take 4 mg by mouth daily. 11/21/20   [provider]  hydrochlorothiazide (HYDRODIURIL) 12.5 MG tablet Take 12.5 mg by mouth daily. 11/09/20   [provider]  HYDROcodone-acetaminophen (NORCO) 5-325 MG tablet Take  1-2 tablets by mouth every 6 (six) hours as needed for moderate pain. 06/27/16   Harris, Abigail, PA-C  JANUVIA 100 MG tablet Take 100 mg by mouth daily. 11/21/20   [provider]  meloxicam (MOBIC) 15 MG tablet Take 15 mg by mouth daily as needed for pain.  07/23/15   [provider]  metFORMIN (GLUCOPHAGE) 500 MG tablet Take 1 tablet (500 mg total) by mouth 2 (two) times daily for 21 days. 10/15/19 11/05/19  Caccavale, Sophia, PA-C  Misc Natural Products (GLUCOSAMINE CHONDROITIN COMPLX) TABS Take 1 tablet by mouth 2 (two) times daily.    [provider]  naproxen (NAPROSYN) 500 MG tablet Take 1 tablet (500 mg total) by mouth 2 (two) times daily with a meal. 06/27/16   Margarita Mail, PA-C  predniSONE (DELTASONE) 20 MG tablet Take 2 tablets  daily with breakfast. 12/08/20   Jaynee Eagles, PA-C  Vitamin D, Ergocalciferol, (DRISDOL) 1.25 MG (50000 UNIT) CAPS capsule Take by mouth. 11/22/20   [provider]    Allergies    Eggs or egg-derived products  Review of Systems   Review of Systems  Constitutional:  Positive for chills. Negative for fever.  HENT: Negative.    Eyes: Negative.   Respiratory:  Negative for shortness of breath.   Cardiovascular: Negative.   Gastrointestinal:  Positive for blood in stool. Negative for abdominal distention, abdominal pain, constipation, diarrhea, nausea, rectal pain and vomiting.  Endocrine: Negative.   Genitourinary: Negative.   Musculoskeletal: Negative.   Skin:  Negative for rash.  Neurological:  Negative for headaches.  All other systems reviewed and are negative.  Physical Exam Updated Vital Signs BP 129/86    Pulse 69    Temp 98.2 F (36.8 C) (Oral)    Resp 14    SpO2 99%   Physical Exam Vitals and nursing note reviewed. Exam conducted with a chaperone present.  Constitutional:      General: He is not in acute distress.    Appearance: He is not ill-appearing.  HENT:     Head: Atraumatic.  Eyes:     Conjunctiva/sclera: Conjunctivae normal.  Cardiovascular:     Rate and Rhythm: Normal rate and regular rhythm.     Pulses: Normal pulses.     Heart sounds: No murmur heard. Pulmonary:     Effort: Pulmonary effort is normal. No respiratory distress.     Breath sounds: Normal breath sounds.  Abdominal:     General: There is no distension.     Palpations: Abdomen is soft.     Tenderness: There is no abdominal tenderness.     Comments: Abdomen is round, soft.  Normoactive bowel sounds.  There is no guarding or tenderness.  Negative Murphy sign, negative McBurney.  Genitourinary:    Comments: There is significant amounts of bright red blood around perianal region.  No visible hemorrhoids or tearing.  Internal exam without deformity or hemorrhoid. Musculoskeletal:         General: Normal range of motion.     Cervical back: Normal range of motion.  Skin:    General: Skin is warm and dry.     Capillary Refill: Capillary refill takes less than 2 seconds.  Neurological:     General: No focal deficit present.     Mental Status: He is alert.  Psychiatric:        Mood and Affect: Mood normal.    ED Results / Procedures / Treatments   Labs (all labs ordered are  listed, but only abnormal results are displayed) Labs Reviewed  COMPREHENSIVE METABOLIC PANEL - Abnormal; Notable for the following components:      Result Value   Glucose, Bld 128 (*)    All other components within normal limits  POC OCCULT BLOOD, ED - Abnormal; Notable for the following components:   Fecal Occult Bld POSITIVE (*)    All other components within normal limits  CBC  TYPE AND SCREEN  ABO/RH    EKG None  Radiology No results found.  Procedures Procedures   Medications Ordered in ED Medications - No data to display  ED Course  I have reviewed the triage vital signs and the nursing notes.  Pertinent labs & imaging results that were available during my care of the patient were reviewed by me and considered in my medical decision making (see chart for details).    MDM Rules/Calculators/A&P                         This patient presents to the ED for concern of blood in stool, this involves an extensive number of treatment options, and is a complaint that carries with it a high risk of complications and morbidity.  The differential diagnosis includes diverticulitis, colitis, polyps, CRC, hemorrhoids, anal fissure, diverticulitis, IBD, aortoenteric fistula, rectal foreign body, anal fissure   Additional history obtained:  External records from outside source obtained and reviewed including recent urgent care visits    Lab Tests:  I Ordered, reviewed, and interpreted labs.  The pertinent results include: Hemoccult positive, CBC WNL, CMP unremarkable Type and cross  ordered in triage Given patient's CBC was collected about 17 hours prior to my evaluation, I ordered another hemoglobin and hematocrit with Hgn 15.3, Hct 44, downtrending for previous   Imaging Studies ordered:  I ordered imaging studies including CT abdomen pelvis I independently visualized and interpreted imaging which showed diffuse diverticular disease as well as hepatic steatosis indicative of early cirrhosis I agree with the radiologist interpretation    Medicines ordered and prescription drug management:  No medication indicated Reevaluation of the patient after these medicines showed that the patient stayed the same I have reviewed the patients home medicines and have made adjustments as needed   Consultations Obtained:  I requested consultation with the hospitalist, Dr. Madelyn Flavors,  and discussed lab and imaging findings as well as pertinent plan - they recommend: admit for observation   Reevaluation:  After the interventions noted above, I reevaluated the patient and found that they have :stayed the same   Dispostion:  After consideration of the diagnostic results and the patients response to treatment feel that the patent would benefit from admission for observation.   1) Lower GI bleed - patient has large amount of gross blood on physical examination. Labs normal but downtrending. Patient to be admitted for observation. Dr. Madelyn Flavors agrees to admit patient. This was discussed with patient along with results of all labs and imaging. Patient understanding. All questions asked and answered. Pt amenable to plan.   Final Clinical Impression(s) / ED Diagnoses Final diagnoses:  Lower GI bleed    Rx / DC Orders ED Discharge Orders     None        Janell Quiet, PA-C 08/18/21 1011    Rolan Bucco, MD 08/18/21 1243

## 2021-08-19 ENCOUNTER — Inpatient Hospital Stay (HOSPITAL_COMMUNITY): Payer: BC Managed Care – PPO | Admitting: Anesthesiology

## 2021-08-19 ENCOUNTER — Encounter (HOSPITAL_COMMUNITY)
Admission: EM | Disposition: A | Discharge status: Home / Self Care | Payer: Self-pay | Source: Home / Self Care | Attending: Internal Medicine

## 2021-08-19 ENCOUNTER — Encounter (HOSPITAL_COMMUNITY): Payer: Self-pay | Admitting: Internal Medicine

## 2021-08-19 DIAGNOSIS — Z91012 Allergy to eggs: Secondary | ICD-10-CM | POA: Diagnosis not present

## 2021-08-19 DIAGNOSIS — F17290 Nicotine dependence, other tobacco product, uncomplicated: Secondary | ICD-10-CM | POA: Diagnosis present

## 2021-08-19 DIAGNOSIS — Z7984 Long term (current) use of oral hypoglycemic drugs: Secondary | ICD-10-CM | POA: Diagnosis not present

## 2021-08-19 DIAGNOSIS — Z79899 Other long term (current) drug therapy: Secondary | ICD-10-CM | POA: Diagnosis not present

## 2021-08-19 DIAGNOSIS — K7460 Unspecified cirrhosis of liver: Secondary | ICD-10-CM | POA: Diagnosis present

## 2021-08-19 DIAGNOSIS — Z8249 Family history of ischemic heart disease and other diseases of the circulatory system: Secondary | ICD-10-CM | POA: Diagnosis not present

## 2021-08-19 DIAGNOSIS — Z794 Long term (current) use of insulin: Secondary | ICD-10-CM | POA: Diagnosis not present

## 2021-08-19 DIAGNOSIS — E119 Type 2 diabetes mellitus without complications: Secondary | ICD-10-CM | POA: Diagnosis present

## 2021-08-19 DIAGNOSIS — Z6837 Body mass index (BMI) 37.0-37.9, adult: Secondary | ICD-10-CM | POA: Diagnosis not present

## 2021-08-19 DIAGNOSIS — K5731 Diverticulosis of large intestine without perforation or abscess with bleeding: Secondary | ICD-10-CM | POA: Diagnosis present

## 2021-08-19 DIAGNOSIS — I10 Essential (primary) hypertension: Secondary | ICD-10-CM | POA: Diagnosis present

## 2021-08-19 DIAGNOSIS — Z791 Long term (current) use of non-steroidal anti-inflammatories (NSAID): Secondary | ICD-10-CM | POA: Diagnosis not present

## 2021-08-19 DIAGNOSIS — Z833 Family history of diabetes mellitus: Secondary | ICD-10-CM | POA: Diagnosis not present

## 2021-08-19 DIAGNOSIS — Z841 Family history of disorders of kidney and ureter: Secondary | ICD-10-CM | POA: Diagnosis not present

## 2021-08-19 DIAGNOSIS — K635 Polyp of colon: Secondary | ICD-10-CM | POA: Diagnosis present

## 2021-08-19 DIAGNOSIS — K922 Gastrointestinal hemorrhage, unspecified: Secondary | ICD-10-CM | POA: Diagnosis present

## 2021-08-19 DIAGNOSIS — F1010 Alcohol abuse, uncomplicated: Secondary | ICD-10-CM | POA: Diagnosis present

## 2021-08-19 DIAGNOSIS — Z20822 Contact with and (suspected) exposure to covid-19: Secondary | ICD-10-CM | POA: Diagnosis present

## 2021-08-19 DIAGNOSIS — K760 Fatty (change of) liver, not elsewhere classified: Secondary | ICD-10-CM | POA: Diagnosis present

## 2021-08-19 DIAGNOSIS — K254 Chronic or unspecified gastric ulcer with hemorrhage: Secondary | ICD-10-CM | POA: Diagnosis present

## 2021-08-19 DIAGNOSIS — E1169 Type 2 diabetes mellitus with other specified complication: Secondary | ICD-10-CM | POA: Diagnosis not present

## 2021-08-19 DIAGNOSIS — E669 Obesity, unspecified: Secondary | ICD-10-CM | POA: Diagnosis present

## 2021-08-19 HISTORY — PX: ESOPHAGOGASTRODUODENOSCOPY (EGD) WITH PROPOFOL: SHX5813

## 2021-08-19 HISTORY — PX: COLONOSCOPY WITH PROPOFOL: SHX5780

## 2021-08-19 HISTORY — PX: BIOPSY: SHX5522

## 2021-08-19 HISTORY — PX: POLYPECTOMY: SHX5525

## 2021-08-19 LAB — CBC
HCT: 42.7 % (ref 39.0–52.0)
Hemoglobin: 15.3 g/dL (ref 13.0–17.0)
MCH: 32.2 pg (ref 26.0–34.0)
MCHC: 35.8 g/dL (ref 30.0–36.0)
MCV: 89.9 fL (ref 80.0–100.0)
Platelets: 178 10*3/uL (ref 150–400)
RBC: 4.75 MIL/uL (ref 4.22–5.81)
RDW: 11.6 % (ref 11.5–15.5)
WBC: 6.5 10*3/uL (ref 4.0–10.5)
nRBC: 0 % (ref 0.0–0.2)

## 2021-08-19 LAB — GLUCOSE, CAPILLARY
Glucose-Capillary: 143 mg/dL — ABNORMAL HIGH (ref 70–99)
Glucose-Capillary: 95 mg/dL (ref 70–99)
Glucose-Capillary: 71 mg/dL (ref 70–99)
Glucose-Capillary: 237 mg/dL — ABNORMAL HIGH (ref 70–99)

## 2021-08-19 LAB — BASIC METABOLIC PANEL
Anion gap: 6 (ref 5–15)
BUN: 7 mg/dL (ref 6–20)
CO2: 26 mmol/L (ref 22–32)
Calcium: 9.1 mg/dL (ref 8.9–10.3)
Chloride: 104 mmol/L (ref 98–111)
Creatinine, Ser: 0.84 mg/dL (ref 0.61–1.24)
GFR, Estimated: >60 mL/min (ref >60)
Glucose, Bld: 112 mg/dL — ABNORMAL HIGH (ref 70–99)
Potassium: 3.7 mmol/L (ref 3.5–5.1)
Sodium: 136 mmol/L (ref 135–145)

## 2021-08-19 LAB — MAGNESIUM: Magnesium: 1.8 mg/dL (ref 1.7–2.4)

## 2021-08-19 LAB — PHOSPHORUS: Phosphorus: 4 mg/dL (ref 2.5–4.6)

## 2021-08-19 SURGERY — ESOPHAGOGASTRODUODENOSCOPY (EGD) WITH PROPOFOL
Anesthesia: Monitor Anesthesia Care

## 2021-08-19 MED ORDER — PROPOFOL 10 MG/ML IV BOLUS
INTRAVENOUS | Status: DC | PRN
  Administered 2021-08-19 (×2): 30 mg via INTRAVENOUS
  Administered 2021-08-19: 50 mg via INTRAVENOUS

## 2021-08-19 MED ORDER — LACTATED RINGERS IV SOLN: INTRAVENOUS | Status: DC | PRN

## 2021-08-19 MED ORDER — LIDOCAINE 2% (20 MG/ML) 5 ML SYRINGE
INTRAMUSCULAR | Status: DC | PRN
  Administered 2021-08-19: 60 mg via INTRAVENOUS

## 2021-08-19 MED ORDER — ONDANSETRON HCL 4 MG/2ML IJ SOLN
INTRAMUSCULAR | Status: DC | PRN
  Administered 2021-08-19: 4 mg via INTRAVENOUS

## 2021-08-19 MED ORDER — PROPOFOL 500 MG/50ML IV EMUL
INTRAVENOUS | Status: DC | PRN
  Administered 2021-08-19: 100 ug/kg/min via INTRAVENOUS

## 2021-08-19 SURGICAL SUPPLY — 25 items

## 2021-08-19 NOTE — Op Note (Signed)
[facility] Patient Name: Louis Gibson Procedure Date : 08/19/2021 MRN: [id number] Attending MD: Jeani Hawking , MD Date of Birth: 09/21/1965 CSN: [id number] Age: 55 Admit Type: Inpatient Procedure:                Colonoscopy Indications:              Hematochezia Providers:                Jeani Hawking, MD, Glory Rosebush, RN, Priscella Mann,                            Technician Referring MD:              Medicines:                Propofol per Anesthesia Complications:            No immediate complications. Estimated Blood Loss:     Estimated blood loss: none. Procedure:                Pre-Anesthesia Assessment:                           - Prior to the procedure, a History and Physical                            was performed, and patient medications and                            allergies were reviewed. The patient's tolerance of                            previous anesthesia was also reviewed. The risks                            and benefits of the procedure and the sedation                            options and risks were discussed with the patient.                            All questions were answered, and informed consent                            was obtained. Prior Anticoagulants: The patient has                            taken no previous anticoagulant or antiplatelet                            agents. ASA Grade Assessment: III - A patient with                            severe systemic disease. After reviewing the risks                            and benefits, the  patient was deemed in                            satisfactory condition to undergo the procedure.                           - Sedation was administered by an anesthesia                            professional. Deep sedation was attained.                           After obtaining informed consent, the colonoscope                            was passed under direct vision. Throughout the                             procedure, the patient's blood pressure, pulse, and                            oxygen saturations were monitored continuously. The                            [id number]-HQ190TL ([id number] Olympus peds colonoscope was                            introduced through the anus and advanced to the the                            cecum, identified by appendiceal orifice and                            ileocecal valve. The colonoscopy was performed                            without difficulty. The patient tolerated the                            procedure well. The quality of the bowel                            preparation was evaluated using the BBPS [facility]                            Bowel Preparation Scale) with scores of: Right                            Colon = 2 (minor amount of residual staining, small                            fragments of stool and/or opaque liquid, but mucosa  seen well), Transverse Colon = 3 (entire mucosa                            seen well with no residual staining, small                            fragments of stool or opaque liquid) and Left Colon                            = 3 (entire mucosa seen well with no residual                            staining, small fragments of stool or opaque                            liquid). The total BBPS score equals 8. The quality                            of the bowel preparation was good. The ileocecal                            valve, appendiceal orifice, and rectum were                            photographed. Scope In: 4:19:16 PM Scope Out: 4:36:15 PM Scope Withdrawal Time: 0 hours 13 minutes 11 seconds  Total Procedure Duration: 0 hours 16 minutes 59 seconds  Findings:      Two sessile polyps were found in the cecum. The polyps were 1 to 2 mm in       size. These polyps were removed with a cold snare. Resection and       retrieval were complete.      Scattered small and large-mouthed  diverticula were found in the entire       colon.      The majority of the diverticula were noted in the ascending colon. There       was no evidence of any blood in the colon. Impression:               - Two 1 to 2 mm polyps in the cecum, removed with a                            cold snare. Resected and retrieved.                           - Diverticulosis in the entire examined colon. Recommendation:           - Return patient to hospital ward for ongoing care.                           - Resume regular diet.                           - Continue present medications.                           -  Await pathology results.                           - Repeat colonoscopy in 7 years for surveillance.                           - Okay to D/C home. Procedure Code(s):        --- Professional ---                           [phone], Colonoscopy, flexible; with removal of                            tumor(s), polyp(s), or other lesion(s) by snare                            technique Diagnosis Code(s):        --- Professional ---                           K63.5, Polyp of colon                           K92.1, Melena (includes Hematochezia)                           K57.30, Diverticulosis of large intestine without                            perforation or abscess without bleeding CPT copyright 2019 [employer]. All rights reserved. The codes documented in this report are preliminary and upon coder review may  be revised to meet current compliance requirements. Jeani Hawking, MD Jeani Hawking, MD 08/19/2021 4:45:54 PM This report has been signed electronically. Number of Addenda: 0

## 2021-08-19 NOTE — Anesthesia Preprocedure Evaluation (Addendum)
Anesthesia Evaluation  Patient identified by MRN, date of birth, ID band Patient awake    Reviewed: Allergy & Precautions, NPO status , Patient's Chart, lab work & pertinent test results  History of Anesthesia Complications Negative for: history of anesthetic complications  Airway Mallampati: II  TM Distance: >3 FB Neck ROM: Full    Dental  (+) Dental Advisory Given, Chipped   Pulmonary COPD, Current Smoker and Patient abstained from smoking.,    Pulmonary exam normal        Cardiovascular hypertension, Pt. on medications Normal cardiovascular exam     Neuro/Psych negative neurological ROS  negative psych ROS   GI/Hepatic negative GI ROS, (+)     substance abuse  alcohol use,   Endo/Other  diabetes, Type 2, Oral Hypoglycemic Agents Obesity   Renal/GU negative Renal ROS     Musculoskeletal  (+) Arthritis ,   Abdominal   Peds  Hematology negative hematology ROS (+)   Anesthesia Other Findings   Reproductive/Obstetrics                            Anesthesia Physical Anesthesia Plan  ASA: 3  Anesthesia Plan: MAC   Post-op Pain Management: Minimal or no pain anticipated   Induction:   PONV Risk Score and Plan: 1 and Propofol infusion and Treatment may vary due to age or medical condition  Airway Management Planned: Nasal Cannula and Natural Airway  Additional Equipment: None  Intra-op Plan:   Post-operative Plan:   Informed Consent: I have reviewed the patients History and Physical, chart, labs and discussed the procedure including the risks, benefits and alternatives for the proposed anesthesia with the patient or authorized representative who has indicated his/her understanding and acceptance.       Plan Discussed with: CRNA and Anesthesiologist  Anesthesia Plan Comments:        Anesthesia Quick Evaluation

## 2021-08-19 NOTE — Progress Notes (Signed)
PROGRESS NOTE        PATIENT DETAILS Name: Louis Gibson Age: 55 y.o. Sex: male Date of Birth: May 10, 1966 Admit Date: 08/17/2021 Admitting Physician Dewayne Shorter Levora Dredge, MD GOT:[id number], Dorma Russell, MD  Brief Narrative: Patient is a 55 y.o. male with history of HTN, DM, EtOH use who presented with hematochezia-admitted for further evaluation and treatment.  Subjective: Lying comfortably in bed-denies any chest pain or shortness of breath.  Per patient-hematochezia clearing up with colonoscopy prep.  Objective: Vitals: Blood pressure 134/81, pulse 66, temperature 97.8 F (36.6 C), temperature source Temporal, resp. rate 14, height 6' (1.829 m), weight 125.9 kg, SpO2 99 %.   Exam: Gen Exam:Alert awake-not in any distress HEENT:atraumatic, normocephalic Chest: B/L clear to auscultation anteriorly CVS:S1S2 regular Abdomen:soft non tender, non distended Extremities:no edema Neurology: Non focal Skin: no rash  Pertinent Labs/Radiology: Recent Labs  Lab 08/17/21 1923 08/18/21 0930 08/19/21 0204  WBC 8.6  --  6.5  HGB 16.7   < > 15.3  PLT 214  --  178  NA 137  --  136  K 4.4  --  3.7  CREATININE 0.88  --  0.84  AST 25  --   --   ALT 30  --   --   ALKPHOS 62  --   --   BILITOT 1.1  --   --    < > = values in this interval not displayed.     Assessment/Plan: Lower GI bleeding: Suspicion for diverticular bleed-hemoglobin stable-colonoscopy later today.  HTN: BP stable-continue amlodipine/HCTZ.  DM-2: CBG stable with SSI-resume oral hypoglycemic agents on discharge.  EtOH use: No signs of withdrawal symptoms-counseled.  Per GI-screening EGD for varices planned today.  ?  Cirrhosis: Nodular liver seen on CT scan-likely due to EtOH use-further work-up/evaluation can be done in outpatient setting.  He is compensated clinically.  Obesity Estimated body mass index is 37.64 kg/m as calculated from the following:   Height as of this encounter: 6'  (1.829 m).   Weight as of this encounter: 125.9 kg.    Procedures: None Consults: GI DVT Prophylaxis: SCD's Code Status:Full code  Family Communication: None at bedside  Time spent: 25- minutes-Greater than 50% of this time was spent in counseling, explanation of diagnosis, planning of further management, and coordination of care.   Disposition Plan: Status is: Inpatient  Remains inpatient appropriate because: Your bleeding-for endoscopic evaluation later today.    Diet: Diet Order             Diet NPO time specified  Diet effective midnight                     Antimicrobial agents: Anti-infectives (From admission, onward)    None        MEDICATIONS: Scheduled Meds:  [MAR Hold] amLODipine  10 mg Oral Daily   [MAR Hold] folic acid  1 mg Oral Daily   [MAR Hold] hydrochlorothiazide  12.5 mg Oral Daily   [MAR Hold] insulin aspart  0-9 Units Subcutaneous TID WC   [MAR Hold] multivitamin with minerals  1 tablet Oral Daily   [MAR Hold] pantoprazole (PROTONIX) IV  40 mg Intravenous Q12H   [MAR Hold] sodium chloride flush  3 mL Intravenous Q12H   [MAR Hold] thiamine  100 mg Oral Daily   Or   [MAR Hold] thiamine  100 mg Intravenous Daily   Continuous Infusions:  sodium chloride 75 mL/hr at 08/19/21 0658   sodium chloride     PRN Meds:.[MAR Hold] acetaminophen **OR** [MAR Hold] acetaminophen, [MAR Hold] albuterol, [MAR Hold] LORazepam **OR** [MAR Hold] LORazepam, [MAR Hold] ondansetron **OR** [MAR Hold] ondansetron (ZOFRAN) IV   I have personally reviewed following labs and imaging studies  LABORATORY DATA: CBC: Recent Labs  Lab 08/17/21 1923 08/18/21 0930 08/19/21 0204  WBC 8.6  --  6.5  HGB 16.7 15.3 15.3  HCT 48.0 44.4 42.7  MCV 90.9  --  89.9  PLT 214  --  178    Basic Metabolic Panel: Recent Labs  Lab 08/17/21 1923 08/19/21 0204  NA 137 136  K 4.4 3.7  CL 101 104  CO2 25 26  GLUCOSE 128* 112*  BUN 15 7  CREATININE 0.88 0.84  CALCIUM  9.8 9.1  MG  --  1.8  PHOS  --  4.0    GFR: Estimated Creatinine Clearance: 133 mL/min (by C-G formula based on SCr of 0.84 mg/dL).  Liver Function Tests: Recent Labs  Lab 08/17/21 1923  AST 25  ALT 30  ALKPHOS 62  BILITOT 1.1  PROT 7.1  ALBUMIN 4.1   No results for input(s): LIPASE, AMYLASE in the last 168 hours. No results for input(s): AMMONIA in the last 168 hours.  Coagulation Profile: No results for input(s): INR, PROTIME in the last 168 hours.  Cardiac Enzymes: No results for input(s): CKTOTAL, CKMB, CKMBINDEX, TROPONINI in the last 168 hours.  BNP (last 3 results) No results for input(s): PROBNP in the last 8760 hours.  Lipid Profile: No results for input(s): CHOL, HDL, LDLCALC, TRIG, CHOLHDL, LDLDIRECT in the last 72 hours.  Thyroid Function Tests: No results for input(s): TSH, T4TOTAL, FREET4, T3FREE, THYROIDAB in the last 72 hours.  Anemia Panel: No results for input(s): VITAMINB12, FOLATE, FERRITIN, TIBC, IRON, RETICCTPCT in the last 72 hours.  Urine analysis: No results found for: COLORURINE, APPEARANCEUR, LABSPEC, PHURINE, GLUCOSEU, HGBUR, BILIRUBINUR, KETONESUR, PROTEINUR, UROBILINOGEN, NITRITE, LEUKOCYTESUR  Sepsis Labs: Lactic Acid, Venous No results found for: LATICACIDVEN  MICROBIOLOGY: Recent Results (from the past 240 hour(s))  Resp Panel by RT-PCR (Flu A&B, Covid) Nasopharyngeal Swab     Status: None   Collection Time: 08/18/21 10:05 AM   Specimen: Nasopharyngeal Swab; Nasopharyngeal(NP) swabs in vial transport medium  Result Value Ref Range Status   SARS Coronavirus 2 by RT PCR NEGATIVE NEGATIVE Final    Comment: (NOTE) SARS-CoV-2 target nucleic acids are NOT DETECTED.  The SARS-CoV-2 RNA is generally detectable in upper respiratory specimens during the acute phase of infection. The lowest concentration of SARS-CoV-2 viral copies this assay can detect is 138 copies/mL. A negative result does not preclude SARS-Cov-2 infection and  should not be used as the sole basis for treatment or other patient management decisions. A negative result may occur with  improper specimen collection/handling, submission of specimen other than nasopharyngeal swab, presence of viral mutation(s) within the areas targeted by this assay, and inadequate number of viral copies(<138 copies/mL). A negative result must be combined with clinical observations, patient history, and epidemiological information. The expected result is Negative.  Fact Sheet for Patients:  BloggerCourse.com  Fact Sheet for Healthcare Providers:  SeriousBroker.it  This test is no t yet approved or cleared by the [place] FDA and  has been authorized for detection and/or diagnosis of SARS-CoV-2 by FDA under an Emergency Use Authorization (EUA). This EUA will remain  in effect (meaning this test can be used) for the duration of the COVID-19 declaration under Section 564(b)(1) of the Act, 21 U.S.C.section 360bbb-3(b)(1), unless the authorization is terminated  or revoked sooner.       Influenza A by PCR NEGATIVE NEGATIVE Final   Influenza B by PCR NEGATIVE NEGATIVE Final    Comment: (NOTE) The Xpert Xpress SARS-CoV-2/FLU/RSV plus assay is intended as an aid in the diagnosis of influenza from Nasopharyngeal swab specimens and should not be used as a sole basis for treatment. Nasal washings and aspirates are unacceptable for Xpert Xpress SARS-CoV-2/FLU/RSV testing.  Fact Sheet for Patients: BloggerCourse.com  Fact Sheet for Healthcare Providers: SeriousBroker.it  This test is not yet approved or cleared by the [place] FDA and has been authorized for detection and/or diagnosis of SARS-CoV-2 by FDA under an Emergency Use Authorization (EUA). This EUA will remain in effect (meaning this test can be used) for the duration of the COVID-19 declaration  under Section 564(b)(1) of the Act, 21 U.S.C. section 360bbb-3(b)(1), unless the authorization is terminated or revoked.  Performed at [facility], [address] [place], [place] [address]     RADIOLOGY STUDIES/RESULTS: CT ABDOMEN PELVIS W CONTRAST  Result Date: 08/18/2021 CLINICAL DATA:  Diffuse abdominal pain and bloody stools which began yesterday. EXAM: CT ABDOMEN AND PELVIS WITH CONTRAST TECHNIQUE: Multidetector CT imaging of the abdomen and pelvis was performed using the standard protocol following bolus administration of intravenous contrast. CONTRAST:  OMNIPAQUE IOHEXOL 300 MG/ML  SOLN COMPARISON:  None. FINDINGS: Lower chest: No acute abnormality. Hepatobiliary: Diffuse hepatic steatosis. Hypertrophy of the lateral segment of left hepatic lobe is noted. Inferior margin of the right hepatic lobe appears slightly irregular and nodular. Gallbladder appears normal. No bile duct dilatation. Pancreas: Unremarkable. No pancreatic ductal dilatation or surrounding inflammatory changes. Spleen: Normal in size without focal abnormality. Adrenals/Urinary Tract: Normal adrenal glands. No nephrolithiasis, hydronephrosis or mass. Urinary bladder is unremarkable. Stomach/Bowel: The stomach appears within normal limits. The appendix is visualized and is normal. No bowel wall thickening, inflammation or distension. Scattered colonic diverticula noted without signs of acute diverticulitis. 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 Normal appearance of the abdominal aorta. No abdominopelvic adenopathy. Reproductive: Prostate is unremarkable. Other: Small fat containing umbilical hernia. No free fluid or fluid collections within the abdomen or pelvis. Musculoskeletal: No acute or significant osseous findings. Lumbar degenerative disc disease is noted. This is most advanced at L4-5 and L5-S1. IMPRESSION: 1. No acute findings identified within the abdomen or pelvis. 2. Diffuse  hepatic steatosis with morphologic features of the liver suggestive of early cirrhosis. 3. Azygous continuation of the IVC. Electronically Signed   By: Signa Kell M.D.   On: 08/18/2021 09:07     LOS: 0 days   Jeoffrey Massed, MD  [facility]    To contact the attending provider between 7A-7P or the covering provider during after hours 7P-7A, please log into the web site [email or web address] and access using universal [place] password for that web site. If you do not have the password, please call the hospital operator.  08/19/2021, 3:22 PM

## 2021-08-19 NOTE — Anesthesia Procedure Notes (Signed)
Procedure Name: MAC Date/Time: 08/19/2021 4:05 PM Performed by: Trinna Post., CRNA Pre-anesthesia Checklist: Patient identified, Emergency Drugs available, Suction available, Patient being monitored and Timeout performed Patient Re-evaluated:Patient Re-evaluated prior to induction Oxygen Delivery Method: Simple face mask Preoxygenation: Pre-oxygenation with 100% oxygen Induction Type: IV induction Placement Confirmation: positive ETCO2

## 2021-08-19 NOTE — Plan of Care (Signed)
Problem: Education: Goal: Knowledge of General Education information will improve Description: Including pain rating scale, medication(s)/side effects and non-pharmacologic comfort measures Outcome: Completed/Met   Problem: Health Behavior/Discharge Planning: Goal: Ability to manage health-related needs will improve Outcome: Completed/Met   Problem: Clinical Measurements: Goal: Will remain free from infection Outcome: Completed/Met Goal: Diagnostic test results will improve Outcome: Completed/Met   

## 2021-08-19 NOTE — Interval H&P Note (Signed)
History and Physical Interval Note:  08/19/2021 3:58 PM  Louis Gibson  has presented today for surgery, with the diagnosis of GI bleed and cirrhosis.  The various methods of treatment have been discussed with the patient and family. After consideration of risks, benefits and other options for treatment, the patient has consented to  Procedure(s): ESOPHAGOGASTRODUODENOSCOPY (EGD) WITH PROPOFOL (N/A) COLONOSCOPY WITH PROPOFOL (N/A) as a surgical intervention.  The patient's history has been reviewed, patient examined, no change in status, stable for surgery.  I have reviewed the patient's chart and labs.  Questions were answered to the patient's satisfaction.     Derrian Rodak D

## 2021-08-19 NOTE — Op Note (Signed)
[facility] Patient Name: Louis Gibson Procedure Date : 08/19/2021 MRN: [id number] Attending MD: Jeani Hawking , MD Date of Birth: [birth year] CSN: [id number] Age: 55 Admit Type: Inpatient Procedure:                Upper GI endoscopy Indications:              Abnormal CT of the GI tract Providers:                Jeani Hawking, MD, Glory Rosebush, RN, Priscella Mann,                            Technician Referring MD:              Medicines:                Propofol per Anesthesia Complications:            No immediate complications. Estimated Blood Loss:     Estimated blood loss: none. Procedure:                Pre-Anesthesia Assessment:                           - Prior to the procedure, a History and Physical                            was performed, and patient medications and                            allergies were reviewed. The patient's tolerance of                            previous anesthesia was also reviewed. The risks                            and benefits of the procedure and the sedation                            options and risks were discussed with the patient.                            All questions were answered, and informed consent                            was obtained. Prior Anticoagulants: The patient has                            taken no previous anticoagulant or antiplatelet                            agents. ASA Grade Assessment: III - A patient with                            severe systemic disease. After reviewing the risks  and benefits, the patient was deemed in                            satisfactory condition to undergo the procedure.                           - Sedation was administered by an anesthesia                            professional. Deep sedation was attained.                           After obtaining informed consent, the endoscope was                            passed under direct vision.  Throughout the                            procedure, the patient's blood pressure, pulse, and                            oxygen saturations were monitored continuously. The                            [id number]-[id number] ([id number] Olympus peds colonoscope was                            introduced through the mouth, and advanced to the                            second part of duodenum. The upper GI endoscopy was                            accomplished without difficulty. The patient                            tolerated the procedure well. Scope In: Scope Out: Findings:      The esophagus was normal.      Three non-bleeding cratered and superficial gastric ulcers with no       stigmata of bleeding were found in the gastric antrum. The largest       lesion was 4 mm in largest dimension. Biopsies were taken with a cold       forceps for Helicobacter pylori testing.      The examined duodenum was normal.      Superficial cratered ulcers were noted in the gastric antrum. No       bleeding was identified. These ulcers were not the source of bleeding. Impression:               - Normal esophagus.                           - Non-bleeding gastric ulcers with no stigmata of  bleeding. Biopsied.                           - Normal examined duodenum. Recommendation:           - Await pathology results.                           - PPI QD.                           - Avoid NSAIDs.                           - Stop drinking ETOH.                           - Follow up in the office in one month.                           - Repeat EGD for esophageal surveillance will                            depend on his ETOH status. Procedure Code(s):        --- Professional ---                           [phone], Esophagogastroduodenoscopy, flexible,                            transoral; with biopsy, single or multiple Diagnosis Code(s):        --- Professional ---                           K25.9,  Gastric ulcer, unspecified as acute or                            chronic, without hemorrhage or perforation                           R93.3, Abnormal findings on diagnostic imaging of                            other parts of digestive tract CPT copyright 2019 American [employer] Association. All rights reserved. The codes documented in this report are preliminary and upon coder review may  be revised to meet current compliance requirements. Jeani Hawking, MD Jeani Hawking, MD 08/19/2021 4:54:48 PM This report has been signed electronically. Number of Addenda: 0

## 2021-08-19 NOTE — Transfer of Care (Signed)
Immediate Anesthesia Transfer of Care Note  Patient: Levorn Oleski  Procedure(s) Performed: ESOPHAGOGASTRODUODENOSCOPY (EGD) WITH PROPOFOL COLONOSCOPY WITH PROPOFOL BIOPSY POLYPECTOMY  Patient Location: Endoscopy Unit  Anesthesia Type:MAC  Level of Consciousness: awake and drowsy  Airway & Oxygen Therapy: Patient Spontanous Breathing and Patient connected to face mask oxygen  Post-op Assessment: Report given to RN and Post -op Vital signs reviewed and stable  Post vital signs: Reviewed and stable  Last Vitals:  Vitals Value Taken Time  BP 112/74 08/19/21 1645  Temp 36.2 C 08/19/21 1645  Pulse 67 08/19/21 1645  Resp 12 08/19/21 1645  SpO2 100 % 08/19/21 1645  Vitals shown include unvalidated device data.  Last Pain:  Vitals:   08/19/21 1645  TempSrc: Temporal  PainSc:          Complications: No notable events documented.

## 2021-08-20 DIAGNOSIS — K254 Chronic or unspecified gastric ulcer with hemorrhage: Principal | ICD-10-CM

## 2021-08-20 LAB — GLUCOSE, CAPILLARY
Glucose-Capillary: 139 mg/dL — ABNORMAL HIGH (ref 70–99)
Glucose-Capillary: 157 mg/dL — ABNORMAL HIGH (ref 70–99)

## 2021-08-20 LAB — HEMOGLOBIN AND HEMATOCRIT, BLOOD
HCT: 42.1 % (ref 39.0–52.0)
Hemoglobin: 15.4 g/dL (ref 13.0–17.0)

## 2021-08-20 MED ORDER — THIAMINE HCL 100 MG PO TABS: 100.0000 mg | ORAL_TABLET | Freq: Every day | ORAL | 0 refills | Status: AC

## 2021-08-20 MED ORDER — FOLIC ACID 1 MG PO TABS: 1.0000 mg | ORAL_TABLET | Freq: Every day | ORAL | 0 refills | Status: AC

## 2021-08-20 MED ORDER — PANTOPRAZOLE SODIUM 20 MG PO TBEC: 40.0000 mg | DELAYED_RELEASE_TABLET | Freq: Every day | ORAL | 0 refills | Status: DC

## 2021-08-20 NOTE — Discharge Summary (Signed)
Louis Gibson [id number] DOB: 1965/10/23 DOA: 08/17/2021  PCP: Nolene Ebbs, MD  Admit date: 08/17/2021  Discharge date: 08/20/2021  Admitted From: ED   Disposition:  Home    Recommendations for Outpatient Follow-up:   Follow up with PCP in 1-2 weeks Follow-up with Dr. Carol Ada (as per discharge instructions) at their next available appointment.  You will need to call to make an appointment with them.  PCP or Dr. Benson Norway will need to follow up on the following pending results:  Gastric Biopsy if any biopsies were taken. Liver ultrasound which may be concerning for nodular liver/cirrhosis   Home Health: N/A Equipment/Devices: N/A Consultations: Gastroenterology, Dr. Benson Norway Discharge Condition: Improved CODE STATUS: Full code Diet Recommendation: Heart Healthy carb controlled  Diet Order             Diet Carb Modified           Diet regular Room service appropriate? Yes; Fluid consistency: Thin  Diet effective now                    Chief Complaint  Patient presents with   GI Bleeding     Brief history of present illness from the day of admission and additional interim summary    Louis Gibson is a 55 y.o. male with medical history significant of hypertension, diabetes mellitus type 2, tobacco use, and alcohol abuse who presents with complaints of blood in stools.  Symptoms started yesterday afternoon after he ate lunch.  He initially noted blood in the toilet water after having bowel movement.  Second episode occurred this morning around 2 or 3 AM and reports having a dark black appearing stool with dark blood present.  He denies having any significant abdominal pain, vomiting, chest pain, cough, shortness of breath, lightheadedness, or reported history of easy bruising/bleeding.  He is not  on any blood thinners.  However notes that he takes naproxen at least once daily and intermittently uses [facility] Goody powders for arthritis pain in his back and knees.  Patient smokes 1 cigar/day on average and drinks about a pint of Bacardi rum/day which he chases with Pepsi zero.  Over the holidays he drank a little more, but states that he last drink on Monday and does not feel like he is withdrawing.  He recalls having a colonoscopy in 5 to 6 years ago at [facility] on [address] and had a negative Cologuard 1 to 2 years ago.  He does not recall anything being noted of concern with his colonoscopy.    Upon admission into the emergency department patient was seen to be afebrile with blood pressures 117/78-148/91, and all other vital signs maintained.  Labs from 12/28 were relatively unremarkable with initial hemoglobin 16.7.  ED provider visualized significant amounts of bright red blood around the perianal region with no findings of internal hemorrhoids.  Repeat hemoglobin and hematocrit pending.  [facility] called to admit for observation due to ongoing bleeding.  Hospital Course   Patient was admitted for hematochezia.  Patient remained hemodynamically stable with normal H&H and good blood pressure throughout.  Patient was seen by gastroenterology and underwent EGD and colonoscopy the following day.  EGD showed 3 nonbleeding gastric ulcers that were shallow.  Colonoscopy showed 2 polyps which were excised.  Patient remained hemodynamically stable and was discharged home on Protonix to complete a 60-day course.  He was strongly instructed to stop taking Goody powders, stop taking aspirin, stop taking ibuprofen or Naprosyn or any other NSAIDs.  He was also advised to stop drinking alcohol.  Work-up is notable for a nodular liver seen on CT possibly due to EtOH use.  Patient is to follow-up with this with his PCP.  He was  instructed to stop drinking alcohol.  He said he would discuss it with his PCP.   Discharge diagnosis     Principal Problem:   GI bleed Active Problems:   Essential hypertension   Tobacco abuse   Alcohol abuse   Hepatic steatosis   Diabetes mellitus type 2 in obese [facility])   Lower GI bleeding    Discharge instructions    Discharge Instructions     Call MD for:   Complete by: As directed    Recurrent bleeding in stool or onset of bleeding when vomiting.   Diet Carb Modified   Complete by: As directed    Discharge instructions   Complete by: As directed    1.  Stop taking any Naprosyn.  Do not take ibuprofen.  Both of these drugs can cause ulcers.  You can use Tylenol for pain management. 2.  Stop taking aspirin for the next 60 days. 3.  Stop drinking alcohol.   4.  Take pantoprazole 1 tablet every day for the next 60 days to prevent further bleeding or formation of new ulcers.  5.  It is very important that you make a follow-up appointment with Dr. Jeani Hawking who was your gastroenterologist/stomach doctor.  Please call [phone] to make an appointment, Let them know that Dr. Elnoria Howard saw you in the hospital.       Discharge Medications   Allergies as of 08/20/2021       Reactions   Eggs Or Egg-derived Products Nausea And Vomiting        Medication List     STOP taking these medications    aspirin EC 81 MG tablet   naproxen 500 MG tablet Commonly known as: Naprosyn       TAKE these medications    amLODipine 10 MG tablet Commonly known as: NORVASC Take 10 mg by mouth daily.   cetirizine 10 MG tablet Commonly known as: ZyrTEC Allergy Take 1 tablet (10 mg total) by mouth daily.   cyclobenzaprine 10 MG tablet Commonly known as: FLEXERIL Take 1 tablet (10 mg total) by mouth 2 (two) times daily as needed for muscle spasms.   fluticasone 50 MCG/ACT nasal spray Commonly known as: FLONASE Place 1 spray into both nostrils in the morning and at  bedtime.   folic acid 1 MG tablet Commonly known as: FOLVITE Take 1 tablet (1 mg total) by mouth daily. Start taking on: August 21, 2021   gabapentin 100 MG capsule Commonly known as: NEURONTIN Take 100 mg by mouth daily.   glimepiride 4 MG tablet Commonly known as: AMARYL Take 4 mg by mouth daily.   Glucosamine Chondroitin Complx Tabs Take 1 tablet by mouth 2 (two) times daily.   hydrochlorothiazide 12.5 MG  tablet Commonly known as: HYDRODIURIL Take 12.5 mg by mouth daily.   Januvia 100 MG tablet Generic drug: sitaGLIPtin Take 100 mg by mouth daily.   metFORMIN 500 MG tablet Commonly known as: GLUCOPHAGE Take 1 tablet (500 mg total) by mouth 2 (two) times daily for 21 days.   pantoprazole 20 MG tablet Commonly known as: Protonix Take 2 tablets (40 mg total) by mouth daily.   thiamine 100 MG tablet Take 1 tablet (100 mg total) by mouth daily. Start taking on: August 21, 2021   Vitamin D (Ergocalciferol) 1.25 MG (50000 UNIT) Caps capsule Commonly known as: DRISDOL Take by mouth.          Major procedures and Radiology Reports - PLEASE review detailed and final reports thoroughly  -      CT ABDOMEN PELVIS W CONTRAST  Result Date: 08/18/2021 CLINICAL DATA:  Diffuse abdominal pain and bloody stools which began yesterday. EXAM: CT ABDOMEN AND PELVIS WITH CONTRAST TECHNIQUE: Multidetector CT imaging of the abdomen and pelvis was performed using the standard protocol following bolus administration of intravenous contrast. CONTRAST:  118mL OMNIPAQUE IOHEXOL 300 MG/ML  SOLN COMPARISON:  None. FINDINGS: Lower chest: No acute abnormality. Hepatobiliary: Diffuse hepatic steatosis. Hypertrophy of the lateral segment of left hepatic lobe is noted. Inferior margin of the right hepatic lobe appears slightly irregular and nodular. Gallbladder appears normal. No bile duct dilatation. Pancreas: Unremarkable. No pancreatic ductal dilatation or surrounding inflammatory changes.  Spleen: Normal in size without focal abnormality. Adrenals/Urinary Tract: Normal adrenal glands. No nephrolithiasis, hydronephrosis or mass. Urinary bladder is unremarkable. Stomach/Bowel: The stomach appears within normal limits. The appendix is visualized and is normal. No bowel wall thickening, inflammation or distension. Scattered colonic diverticula noted without signs of acute diverticulitis. 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 Normal appearance of the abdominal aorta. No abdominopelvic adenopathy. Reproductive: Prostate is unremarkable. Other: Small fat containing umbilical hernia. No free fluid or fluid collections within the abdomen or pelvis. Musculoskeletal: No acute or significant osseous findings. Lumbar degenerative disc disease is noted. This is most advanced at L4-5 and L5-S1. IMPRESSION: 1. No acute findings identified within the abdomen or pelvis. 2. Diffuse hepatic steatosis with morphologic features of the liver suggestive of early cirrhosis. 3. Azygous continuation of the IVC. Electronically Signed   By: Kerby Moors M.D.   On: 08/18/2021 09:07    Micro Results    Recent Results (from the past 240 hour(s))  Resp Panel by RT-PCR (Flu A&B, Covid) Nasopharyngeal Swab     Status: None   Collection Time: 08/18/21 10:05 AM   Specimen: Nasopharyngeal Swab; Nasopharyngeal(NP) swabs in vial transport medium  Result Value Ref Range Status   SARS Coronavirus 2 by RT PCR NEGATIVE NEGATIVE Final    Comment: (NOTE) SARS-CoV-2 target nucleic acids are NOT DETECTED.  The SARS-CoV-2 RNA is generally detectable in upper respiratory specimens during the acute phase of infection. The lowest concentration of SARS-CoV-2 viral copies this assay can detect is 138 copies/mL. A negative result does not preclude SARS-Cov-2 infection and should not be used as the sole basis for treatment or other patient management decisions. A negative result may occur with  improper  specimen collection/handling, submission of specimen other than nasopharyngeal swab, presence of viral mutation(s) within the areas targeted by this assay, and inadequate number of viral copies(<138 copies/mL). A negative result must be combined with clinical observations, patient history, and epidemiological information. The expected result is Negative.  Fact Sheet for Patients:  EntrepreneurPulse.com.au  Fact Sheet for Healthcare Providers:  IncredibleEmployment.be  This test is no t yet approved or cleared by the [place] FDA and  has been authorized for detection and/or diagnosis of SARS-CoV-2 by FDA under an Emergency Use Authorization (EUA). This EUA will remain  in effect (meaning this test can be used) for the duration of the COVID-19 declaration under Section 564(b)(1) of the Act, 21 U.S.C.section 360bbb-3(b)(1), unless the authorization is terminated  or revoked sooner.       Influenza A by PCR NEGATIVE NEGATIVE Final   Influenza B by PCR NEGATIVE NEGATIVE Final    Comment: (NOTE) The Xpert Xpress SARS-CoV-2/FLU/RSV plus assay is intended as an aid in the diagnosis of influenza from Nasopharyngeal swab specimens and should not be used as a sole basis for treatment. Nasal washings and aspirates are unacceptable for Xpert Xpress SARS-CoV-2/FLU/RSV testing.  Fact Sheet for Patients: EntrepreneurPulse.com.au  Fact Sheet for Healthcare Providers: IncredibleEmployment.be  This test is not yet approved or cleared by the [place] FDA and has been authorized for detection and/or diagnosis of SARS-CoV-2 by FDA under an Emergency Use Authorization (EUA). This EUA will remain in effect (meaning this test can be used) for the duration of the COVID-19 declaration under Section 564(b)(1) of the Act, 21 U.S.C. section 360bbb-3(b)(1), unless the authorization is terminated or revoked.  Performed at  [facility], [place] [address], [place], [place] [address]     Today   Subjective    Louis Gibson feels much improved since admission.  Feels ready to go home.  Denies chest pain, shortness of breath or abdominal pain.  Feels they can take care of themselves with the resources they have at home.  Objective   Blood pressure 119/70, pulse 66, temperature 98 F (36.7 C), temperature source Oral, resp. rate 20, height 6' (1.829 m), weight 125.5 kg, SpO2 100 %.   Intake/Output Summary (Last 24 hours) at 08/20/2021 1206 Last data filed at 08/20/2021 [id number] Gross per 24 hour  Intake 2192.15 ml  Output 0 ml  Net 2192.15 ml    Exam General: Patient appears well and in good spirits sitting up in bed in no acute distress.  Eyes: sclera anicteric, conjuctiva mild injection bilaterally CVS: S1-S2, regular  Respiratory:  decreased air entry bilaterally secondary to decreased inspiratory effort, rales at bases  GI: NABS, soft, NT  LE: No edema.  Neuro: A/O x 3, Moving all extremities equally with normal strength, CN 3-12 intact, grossly nonfocal.  Psych: patient is logical and coherent, judgement and insight appear normal, mood and affect appropriate to situation.    Data Review   CBC w Diff:  Lab Results  Component Value Date   WBC 6.5 08/19/2021   HGB 15.4 08/20/2021   HCT 42.1 08/20/2021   PLT 178 08/19/2021   LYMPHOPCT 12 10/15/2019   MONOPCT 12 10/15/2019   EOSPCT 0 10/15/2019   BASOPCT 0 10/15/2019    CMP:  Lab Results  Component Value Date   NA 136 08/19/2021   K 3.7 08/19/2021   CL 104 08/19/2021   CO2 26 08/19/2021   BUN 7 08/19/2021   CREATININE 0.84 08/19/2021   CREATININE 0.80 03/28/2021   PROT 7.1 08/17/2021   ALBUMIN 4.1 08/17/2021   BILITOT 1.1 08/17/2021   ALKPHOS 62 08/17/2021   AST 25 08/17/2021   ALT 30 08/17/2021  .   Total Time in preparing paper work, data evaluation and todays exam - 35 minutes  [place]  Derek Jack M.D on  08/20/2021 at 12:06 PM  [facility]

## 2021-08-20 NOTE — Progress Notes (Signed)
Discharge instructions reviewed. Questions answered. Patient discharging home via own private auto. MD letter for work provided to patient.

## 2021-08-21 ENCOUNTER — Other Ambulatory Visit: Payer: Self-pay | Admitting: Internal Medicine

## 2021-08-21 MED ORDER — PANTOPRAZOLE SODIUM 20 MG PO TBEC: 40.0000 mg | DELAYED_RELEASE_TABLET | Freq: Every day | ORAL | 0 refills | Status: DC

## 2021-08-21 NOTE — Progress Notes (Unsigned)
Called stating pantoprazole prescription did not go through. I spoke with patient who stated [facility] preferred [facility] than [facility]. He was doing well.   [facility]ole [facility] on [address] per request.

## 2021-08-22 ENCOUNTER — Encounter (HOSPITAL_COMMUNITY): Payer: Self-pay | Admitting: Gastroenterology

## 2021-08-23 LAB — SURGICAL PATHOLOGY

## 2021-08-23 NOTE — Anesthesia Postprocedure Evaluation (Signed)
Anesthesia Post Note  Patient: Louis Gibson  Procedure(s) Performed: ESOPHAGOGASTRODUODENOSCOPY (EGD) WITH PROPOFOL COLONOSCOPY WITH PROPOFOL BIOPSY POLYPECTOMY     Patient location during evaluation: PACU Anesthesia Type: MAC Level of consciousness: awake and alert Pain management: pain level controlled Vital Signs Assessment: post-procedure vital signs reviewed and stable Respiratory status: spontaneous breathing, nonlabored ventilation and respiratory function stable Cardiovascular status: stable and blood pressure returned to baseline Anesthetic complications: no   No notable events documented.  Last Vitals:  Vitals:   08/20/21 0415 08/20/21 0925  BP: 139/74 119/70  Pulse: 68 66  Resp: 20 20  Temp: 36.7 C 36.7 C  SpO2: 99% 100%    Last Pain:  Vitals:   08/20/21 0925  TempSrc: Oral  PainSc:                  Audry Pili

## 2021-08-25 ENCOUNTER — Other Ambulatory Visit: Payer: Self-pay | Admitting: Internal Medicine

## 2021-08-26 LAB — CBC
HCT: 45.3 % (ref 38.5–50.0)
Hemoglobin: 15.9 g/dL (ref 13.2–17.1)
MCH: 31.5 pg (ref 27.0–33.0)
MCHC: 35.1 g/dL (ref 32.0–36.0)
MCV: 89.7 fL (ref 80.0–100.0)
MPV: 10.7 fL (ref 7.5–12.5)
Platelets: 238 10*3/uL (ref 140–400)
RBC: 5.05 10*6/uL (ref 4.20–5.80)
RDW: 11.8 % (ref 11.0–15.0)
WBC: 8.8 10*3/uL (ref 3.8–10.8)

## 2021-08-26 LAB — BASIC METABOLIC PANEL WITH GFR
BUN: 13 mg/dL (ref 7–25)
CO2: 24 mmol/L (ref 20–32)
Calcium: 10 mg/dL (ref 8.6–10.3)
Chloride: 103 mmol/L (ref 98–110)
Creat: 1.13 mg/dL (ref 0.70–1.30)
Glucose, Bld: 97 mg/dL (ref 65–99)
Potassium: 3.8 mmol/L (ref 3.5–5.3)
Sodium: 137 mmol/L (ref 135–146)
eGFR: 76 mL/min/{1.73_m2} (ref >=60)

## 2022-03-14 ENCOUNTER — Encounter (HOSPITAL_COMMUNITY): Payer: Self-pay

## 2022-03-14 ENCOUNTER — Ambulatory Visit (HOSPITAL_COMMUNITY)
Admission: EM | Admit: 2022-03-14 | Discharge: 2022-03-14 | Disposition: A | Discharge status: Home / Self Care | Payer: BC Managed Care – PPO | Attending: Family Medicine | Admitting: Family Medicine

## 2022-03-14 DIAGNOSIS — Z1152 Encounter for screening for COVID-19: Secondary | ICD-10-CM | POA: Insufficient documentation

## 2022-03-14 DIAGNOSIS — J069 Acute upper respiratory infection, unspecified: Secondary | ICD-10-CM | POA: Insufficient documentation

## 2022-03-14 MED ORDER — PROMETHAZINE-DM 6.25-15 MG/5ML PO SYRP: 5.0000 mL | ORAL_SOLUTION | Freq: Three times a day (TID) | ORAL | 0 refills | Status: DC | PRN

## 2022-03-14 NOTE — ED Triage Notes (Signed)
Pt states sinus headache,runny nose and fatigue since this am. States he took Tylenol with some relief.

## 2022-03-14 NOTE — ED Provider Notes (Signed)
[facility]    CSN: [id number] Arrival date & time: 03/14/22  1408      History   Chief Complaint Chief Complaint  Patient presents with   Headache    HPI Louis Gibson is a 56 y.o. male.   HPI Patient presents today for evaluation of cough, sinus headache, and runny nose.  Patient reports that symptoms developed today.  He attempted to go to work today however due to the fatigue he had to leave work early.  He has continued to have cough generalized achiness.  He is uncertain if he has had fever.  He would like to be COVID tested as he has been in and out of the hospital due to his wife was recently hospitalized.  Past Medical History:  Diagnosis Date   Arthritis    Diabetes mellitus without complication (HCC)    Hypertension     Patient Active Problem List   Diagnosis Date Noted   Lower GI bleeding 08/19/2021   COPD with acute exacerbation (HCC) 08/18/2021   Alcohol abuse 08/18/2021   Hepatic steatosis 08/18/2021   GI bleed 08/18/2021   Diabetes mellitus type 2 in obese (HCC) 08/18/2021   Chest pain 08/11/2015   Essential hypertension 08/11/2015   Tobacco abuse 08/11/2015   Retrosternal chest pain 08/10/2015    Past Surgical History:  Procedure Laterality Date   BIOPSY  08/19/2021   Procedure: BIOPSY;  Surgeon: Jeani Hawking, MD;  Location[facility] ENDOSCOPY;  Service: Endoscopy;;   COLONOSCOPY WITH PROPOFOL N/A 08/19/2021   Procedure: COLONOSCOPY WITH PROPOFOL;  Surgeon: Jeani Hawking, MD;  Location: [facility] ENDOSCOPY;  Service: Endoscopy;  Laterality: N/A;   ESOPHAGOGASTRODUODENOSCOPY (EGD) WITH PROPOFOL N/A 08/19/2021   Procedure: ESOPHAGOGASTRODUODENOSCOPY (EGD) WITH PROPOFOL;  Surgeon: Jeani Hawking, MD;  Location: [facility] ENDOSCOPY;  Service: Endoscopy;  Laterality: N/A;   POLYPECTOMY  08/19/2021   Procedure: POLYPECTOMY;  Surgeon: Jeani Hawking, MD;  Location: [facility] ENDOSCOPY;  Service: Endoscopy;;       Home Medications    Prior to Admission  medications   Medication Sig Start Date End Date Taking? Authorizing Provider  promethazine-dextromethorphan (PROMETHAZINE-DM) 6.25-15 MG/5ML syrup Take 5 mLs by mouth 3 (three) times daily as needed for cough. 03/14/22  Yes Bing Neighbors, FNP  amLODipine (NORVASC) 10 MG tablet Take 10 mg by mouth daily. 05/28/21   [provider]  cetirizine (ZYRTEC ALLERGY) 10 MG tablet Take 1 tablet (10 mg total) by mouth daily. 01/04/21   Particia Nearing, PA-C  cyclobenzaprine (FLEXERIL) 10 MG tablet Take 1 tablet (10 mg total) by mouth 2 (two) times daily as needed for muscle spasms. 06/27/16   Keimya Briddell, Abigail, PA-C  fluticasone (FLONASE) 50 MCG/ACT nasal spray Place 1 spray into both nostrils in the morning and at bedtime. 01/04/21   Particia Nearing, PA-C  gabapentin (NEURONTIN) 100 MG capsule Take 100 mg by mouth daily. 04/25/21   [provider]  glimepiride (AMARYL) 4 MG tablet Take 4 mg by mouth daily. 11/21/20   [provider]  hydrochlorothiazide (HYDRODIURIL) 12.5 MG tablet Take 12.5 mg by mouth daily. 11/09/20   [provider]  JANUVIA 100 MG tablet Take 100 mg by mouth daily. 11/21/20   [provider]  metFORMIN (GLUCOPHAGE) 500 MG tablet Take 1 tablet (500 mg total) by mouth 2 (two) times daily for 21 days. 10/15/19 11/05/19  Caccavale, Sophia, PA-C  Misc Natural Products (GLUCOSAMINE CHONDROITIN COMPLX) TABS Take 1 tablet by mouth 2 (two) times daily.  [provider]  pantoprazole (PROTONIX) 20 MG tablet Take 2 tablets (40 mg total) by mouth daily. 08/21/21 10/20/21  Pieter Partridge, MD  Vitamin D, Ergocalciferol, (DRISDOL) 1.25 MG (50000 UNIT) CAPS capsule Take by mouth. 11/22/20   [provider]    Family History Family History  Problem Relation Age of Onset   Hypertension Brother    CAD Maternal Uncle    Diabetes Mellitus II Maternal Uncle    Kidney disease Brother     Social History Social History    Tobacco Use   Smoking status: Every Day    Types: Cigars   Smokeless tobacco: Never   Tobacco comments:    1 -2 cigars a day.  Substance Use Topics   Alcohol use: Yes    Comment: 2 - 4 beers on the weekend.   Drug use: No     Allergies   Eggs or egg-derived products   Review of Systems Review of Systems Pertinent negatives listed in HPI   Physical Exam Triage Vital Signs ED Triage Vitals  Enc Vitals Group     BP 03/14/22 1450 (!) 144/90     Pulse Rate 03/14/22 1450 80     Resp 03/14/22 1450 16     Temp 03/14/22 1450 98.3 F (36.8 C)     Temp src --      SpO2 03/14/22 1450 96 %     Weight --      Height --      Head Circumference --      Peak Flow --      Pain Score 03/14/22 1452 8     Pain Loc --      Pain Edu? --      Excl. in GC? --    No data found.  Updated Vital Signs BP (!) 144/90 (BP Location: Right Arm)   Pulse 80   Temp 98.3 F (36.8 C)   Resp 16   SpO2 96%   Visual Acuity Right Eye Distance:   Left Eye Distance:   Bilateral Distance:    Right Eye Near:   Left Eye Near:    Bilateral Near:     Physical Exam  General Appearance:    Alert, cooperative, no distress  HENT:   Normocephalic, ears normal, nares mucosal edema with congestion, rhinorrhea, oropharynx  w/o erythema or swelling  Eyes:    PERRL, conjunctiva/corneas clear, EOM's intact       Lungs:     Clear to auscultation bilaterally, respirations unlabored  Heart:    Regular rate and rhythm  Neurologic:   Awake, alert, oriented x 3. No apparent focal neurological           defect.      UC Treatments / Results  Labs (all labs ordered are listed, but only abnormal results are displayed) Labs Reviewed  SARS CORONAVIRUS 2 (TAT 6-24 HRS)    EKG   Radiology No results found.  Procedures Procedures (including critical care time)  Medications Ordered in UC Medications - No data to display  Initial Impression / Assessment and Plan / UC Course  I have reviewed the  triage vital signs and the nursing notes.  Pertinent labs & imaging results that were available during my care of the patient were reviewed by me and considered in my medical decision making (see chart for details).    Viral URI with Cough COVID/Flu test pending. Symptom management warranted only.  Manage fever with Tylenol and ibuprofen.  Nasal symptoms with over-the-counter antihistamines recommended.  Treatment per discharge medications/discharge instructions.  Red flags/ER precautions given. The most current CDC isolation/quarantine recommendation advised.    Final Clinical Impressions(s) / UC Diagnoses   Final diagnoses:  Viral URI with cough  Encounter for screening for COVID-19     Discharge Instructions      Your COVID test will result within 24 hours directly to your MyChart.  If your results are positive you should not return to work until 03/20/22.  I have provided you with a work note to return on Thursday, 03/16/2022 if your results are negative. Treating you for a viral respiratory illness.  Symptom management warranted only.  Rest.  Hydrate well with fluids.  If any worrisome symptoms of chest pain, shortness of breath or generalized weakness develop go to the emergency department for further work-up and evaluation for     ED Prescriptions     Medication Sig Dispense Auth. Provider   promethazine-dextromethorphan (PROMETHAZINE-DM) 6.25-15 MG/5ML syrup Take 5 mLs by mouth 3 (three) times daily as needed for cough. 180 mL Bing Neighbors, FNP      PDMP not reviewed this encounter.   Bing Neighbors, FNP 03/14/22 1558

## 2022-03-14 NOTE — Discharge Instructions (Addendum)
Your COVID test will result within 24 hours directly to your MyChart.  If your results are positive you should not return to work until 03/20/22.  I have provided you with a work note to return on Thursday, 03/16/2022 if your results are negative. Treating you for a viral respiratory illness.  Symptom management warranted only.  Rest.  Hydrate well with fluids.  If any worrisome symptoms of chest pain, shortness of breath or generalized weakness develop go to the emergency department for further work-up and evaluation for

## 2022-03-15 LAB — SARS CORONAVIRUS 2 (TAT 6-24 HRS): SARS Coronavirus 2: NEGATIVE

## 2022-09-05 ENCOUNTER — Other Ambulatory Visit (HOSPITAL_COMMUNITY): Payer: Self-pay | Admitting: Gastroenterology

## 2022-09-05 DIAGNOSIS — K7460 Unspecified cirrhosis of liver: Secondary | ICD-10-CM

## 2022-09-12 ENCOUNTER — Ambulatory Visit (HOSPITAL_COMMUNITY)
Admission: RE | Admit: 2022-09-12 | Discharge: 2022-09-12 | Disposition: A | Discharge status: Home / Self Care | Payer: BC Managed Care – PPO | Source: Ambulatory Visit | Attending: Gastroenterology | Admitting: Gastroenterology

## 2022-09-12 DIAGNOSIS — K7460 Unspecified cirrhosis of liver: Secondary | ICD-10-CM | POA: Diagnosis not present

## 2022-10-24 ENCOUNTER — Ambulatory Visit (HOSPITAL_COMMUNITY)
Admission: EM | Admit: 2022-10-24 | Discharge: 2022-10-24 | Disposition: A | Discharge status: Home / Self Care | Payer: BC Managed Care – PPO | Attending: Emergency Medicine | Admitting: Emergency Medicine

## 2022-10-24 ENCOUNTER — Encounter (HOSPITAL_COMMUNITY): Payer: Self-pay

## 2022-10-24 DIAGNOSIS — Z20822 Contact with and (suspected) exposure to covid-19: Secondary | ICD-10-CM | POA: Diagnosis not present

## 2022-10-24 DIAGNOSIS — F17290 Nicotine dependence, other tobacco product, uncomplicated: Secondary | ICD-10-CM | POA: Diagnosis not present

## 2022-10-24 DIAGNOSIS — J069 Acute upper respiratory infection, unspecified: Secondary | ICD-10-CM | POA: Insufficient documentation

## 2022-10-24 DIAGNOSIS — M549 Dorsalgia, unspecified: Secondary | ICD-10-CM | POA: Insufficient documentation

## 2022-10-24 DIAGNOSIS — Z20828 Contact with and (suspected) exposure to other viral communicable diseases: Secondary | ICD-10-CM | POA: Insufficient documentation

## 2022-10-24 DIAGNOSIS — R059 Cough, unspecified: Secondary | ICD-10-CM | POA: Diagnosis present

## 2022-10-24 LAB — POC INFLUENZA A AND B ANTIGEN (URGENT CARE ONLY)
INFLUENZA A ANTIGEN, POC: NEGATIVE
INFLUENZA B ANTIGEN, POC: NEGATIVE

## 2022-10-24 MED ORDER — FLUTICASONE PROPIONATE 50 MCG/ACT NA SUSP
1.0000 | Freq: Every day | NASAL | 2 refills | Status: DC
Start: 2022-10-24 — End: 2023-09-10

## 2022-10-24 MED ORDER — CETIRIZINE HCL 10 MG PO TABS: 10.0000 mg | ORAL_TABLET | Freq: Every day | ORAL | 1 refills | Status: DC

## 2022-10-24 MED ORDER — PROMETHAZINE-DM 6.25-15 MG/5ML PO SYRP: 5.0000 mL | ORAL_SOLUTION | Freq: Every evening | ORAL | 0 refills | Status: DC | PRN

## 2022-10-24 NOTE — Discharge Instructions (Addendum)
Your rapid influenza antigen test today was negative.  No further influenza testing is indicated.     If your COVID-19 PCR test is positive, you will be contacted by phone.  Please discuss with the callback nurse whether or not you would benefit from antiviral therapy treatment for COVID-19.   If your COVID-19 PCR test is negative, please consider retesting in the next 2 to 3 days, particularly if you are not feeling any better.  You are welcome to return here to urgent care to have it done or you can take a home COVID-19 test.   If both your COVID-19 tests are negative, then you can safely assume that your illness is due to one of the many less serious illnesses circulating in our community right now.     Conservative care is recommended with rest, drinking plenty of clear fluids, eating only when hungry, taking supportive medications for your symptoms and avoiding being around other people.  Please remain at home until you are fever free for 24 hours without the use of antifever medications such as Tylenol and ibuprofen.   Based on my physical exam findings and the history you have provided  today, I do not recommend antibiotics at this time.  I do not believe the risks and side effects of antibiotics would outweigh any minimal benefit that they might provide.       Please read below to learn more about the medications, dosages and frequencies that I recommend to help alleviate your symptoms and to get you feeling better soon:   Zyrtec (cetirizine): This is an excellent second-generation antihistamine that helps to reduce respiratory inflammatory response to environmental allergens.  In some patients, this medication can cause daytime sleepiness so I recommend that you take 1 tablet daily at bedtime.     Flonase (fluticasone): This is a steroid nasal spray that used once daily, 1 spray in each nare.  This works best when used on a daily basis. This medication does not work well if it is only used  when you think you need it.  After 3 to 5 days of use, you will notice significant reduction of the inflammation and mucus production that is currently being caused by exposure to allergens, whether seasonal or environmental.  The most common side effect of this medication is nosebleeds.  If you experience a nosebleed, please discontinue use for 1 week, then feel free to resume.  If you find that your insurance will not pay for this medication, please consider a different nasal steroids such as Nasonex (mometasone), or Nasacort (triamcinolone).    Promethazine DM: Promethazine is both a nasal decongestant and an antinausea medication that makes most patients feel fairly sleepy.  The DM is dextromethorphan, a cough suppressant found in many over-the-counter cough medications.  Please take 5 mL before bedtime to minimize your cough which will help you sleep better.  I have sent a prescription for this medication to your pharmacy.   Please follow-up within the next 5-7 days either with your primary care provider or urgent care if your symptoms do not resolve.  If you do not have a primary care provider, we will assist you in finding one.        Thank you for visiting urgent care today.  We appreciate the opportunity to participate in your care.

## 2022-10-24 NOTE — ED Triage Notes (Signed)
Pt is here for fever, headache , runny nose, nasal congestion , cough, back pain , ears feel clogged , low energy,  chills x 1day

## 2022-10-24 NOTE — ED Provider Notes (Signed)
[facility]    CSN: [id number] Arrival date & time: 10/24/22  1011    HISTORY   Chief Complaint  Patient presents with   Cough   Back Pain   Otalgia   HPI Louis Gibson is a pleasant, 57 y.o. male who presents to urgent care today. Pt c/o fever, headache, runny nose, nasal congestion, cough, back pain, ears feel clogged, low energy, chills x 1 day.  Pt has essentially normal VS on arrival today with the exception of very elevated BP.  Pt states he has taken his BP meds today, often has elevated blood pressure when he goes to doctors offices.  Patient states he has been using Flonase at home which has been helping a little bit with a runny nose but would like to be tested for COVID and influenza given multiple exposures at work.  Patient denies sore throat, cough, nausea, vomiting, diarrhea at this time.   The history is provided by the patient.   Past Medical History:  Diagnosis Date   Arthritis    Diabetes mellitus without complication ([place])    Hypertension    Patient Active Problem List   Diagnosis Date Noted   Lower GI bleeding 08/19/2021   COPD with acute exacerbation ([place]) 08/18/2021   Alcohol abuse 08/18/2021   Hepatic steatosis 08/18/2021   GI bleed 08/18/2021   Diabetes mellitus type 2 in obese ([place]) 08/18/2021   Chest pain 08/11/2015   Essential hypertension 08/11/2015   Tobacco abuse 08/11/2015   Retrosternal chest pain 08/10/2015   Past Surgical History:  Procedure Laterality Date   BIOPSY  08/19/2021   Procedure: BIOPSY;  Surgeon: Carol Ada, MD;  Location: [facility];  Service: Endoscopy;;   COLONOSCOPY WITH PROPOFOL N/A 08/19/2021   Procedure: COLONOSCOPY WITH PROPOFOL;  Surgeon: Carol Ada, MD;  Location: [facility];  Service: Endoscopy;  Laterality: N/A;   ESOPHAGOGASTRODUODENOSCOPY (EGD) WITH PROPOFOL N/A 08/19/2021   Procedure: ESOPHAGOGASTRODUODENOSCOPY (EGD) WITH PROPOFOL;  Surgeon: Carol Ada, MD;  Location: [facility];  Service: Endoscopy;  Laterality: N/A;   POLYPECTOMY  08/19/2021   Procedure: POLYPECTOMY;  Surgeon: Carol Ada, MD;  Location: [facility] ENDOSCOPY;  Service: Endoscopy;;    Home Medications    Prior to Admission medications   Medication Sig Start Date End Date Taking? Authorizing Provider  amLODipine (NORVASC) 10 MG tablet Take 10 mg by mouth daily. 05/28/21  Yes [provider]  cetirizine (ZYRTEC ALLERGY) 10 MG tablet Take 1 tablet (10 mg total) by mouth daily. 01/04/21  Yes Volney American, PA-C  fluticasone [facility]) 50 MCG/ACT nasal spray Place 1 spray into both nostrils in the morning and at bedtime. 01/04/21  Yes Volney American, PA-C  gabapentin (NEURONTIN) 100 MG capsule Take 100 mg by mouth daily. 04/25/21  Yes [provider]  glimepiride (AMARYL) 4 MG tablet Take 4 mg by mouth daily. 11/21/20  Yes [provider]  hydrochlorothiazide (HYDRODIURIL) 12.5 MG tablet Take 12.5 mg by mouth daily. 11/09/20  Yes [provider]  JANUVIA 100 MG tablet Take 100 mg by mouth daily. 11/21/20  Yes [provider]  Vitamin D, Ergocalciferol, (DRISDOL) 1.25 MG (50000 UNIT) CAPS capsule Take by mouth. 11/22/20  Yes [provider]  cyclobenzaprine (FLEXERIL) 10 MG tablet Take 1 tablet (10 mg total) by mouth 2 (two) times daily as needed for muscle spasms. 06/27/16   Harris, Vernie Shanks, PA-C  metFORMIN (GLUCOPHAGE) 500 MG tablet Take 1 tablet (500 mg total) by mouth 2 (two) times  daily for 21 days. 10/15/19 11/05/19  Caccavale, Sophia, PA-C  Misc Natural Products (GLUCOSAMINE CHONDROITIN COMPLX) TABS Take 1 tablet by mouth 2 (two) times daily.    [provider]  pantoprazole (PROTONIX) 20 MG tablet Take 2 tablets (40 mg total) by mouth daily. 08/21/21 10/20/21  Vashti Hey, MD  promethazine-dextromethorphan (PROMETHAZINE-DM) 6.25-15 MG/5ML syrup Take 5 mLs by mouth 3 (three) times daily as needed for cough. 03/14/22   Scot Jun, NP    Family History Family History  Problem Relation Age of Onset   Hypertension Brother    CAD Maternal Uncle    Diabetes Mellitus II Maternal Uncle    Kidney disease Brother    Social History Social History   Tobacco Use   Smoking status: Every Day    Types: Cigars   Smokeless tobacco: Never   Tobacco comments:    1 -2 cigars a day.  Substance Use Topics   Alcohol use: Yes    Comment: 2 - 4 beers on the weekend.   Drug use: No   Allergies   Eggs or egg-derived products  Review of Systems Review of Systems Pertinent findings revealed after performing a 14 point review of systems has been noted in the history of present illness.  Physical Exam Vital Signs BP (!) 174/107 (BP Location: Right Arm)   Pulse 85   Temp 98.4 F (36.9 C) (Oral)   Resp 16   SpO2 95%   No data found.  Physical Exam Vitals and nursing note reviewed.  Constitutional:      General: He is not in acute distress.    Appearance: Normal appearance. He is not ill-appearing.  HENT:     Head: Normocephalic and atraumatic.     Salivary Glands: Right salivary gland is not diffusely enlarged or tender. Left salivary gland is not diffusely enlarged or tender.     Right Ear: Tympanic membrane, ear canal and external ear normal. No drainage. No middle ear effusion. There is no impacted cerumen. Tympanic membrane is not erythematous or bulging.     Left Ear: Tympanic membrane, ear canal and external ear normal. No drainage.  No middle ear effusion. There is no impacted cerumen. Tympanic membrane is not erythematous or bulging.     Nose: Congestion and rhinorrhea present. No nasal deformity, septal deviation or mucosal edema. Rhinorrhea is clear.     Right Turbinates: Not enlarged, swollen or pale.     Left Turbinates: Not enlarged, swollen or pale.     Right Sinus: No maxillary sinus tenderness or frontal sinus tenderness.     Left Sinus: No maxillary sinus tenderness or frontal sinus  tenderness.     Mouth/Throat:     Lips: Pink. No lesions.     Mouth: Mucous membranes are moist. No oral lesions.     Pharynx: Oropharynx is clear. Uvula midline. No posterior oropharyngeal erythema or uvula swelling.     Tonsils: No tonsillar exudate. 0 on the right. 0 on the left.  Eyes:     General: Lids are normal.        Right eye: No discharge.        Left eye: No discharge.     Extraocular Movements: Extraocular movements intact.     Conjunctiva/sclera: Conjunctivae normal.     Right eye: Right conjunctiva is not injected.     Left eye: Left conjunctiva is not injected.  Neck:     Trachea: Trachea and phonation normal.  Cardiovascular:  Rate and Rhythm: Normal rate and regular rhythm.     Pulses: Normal pulses.     Heart sounds: Normal heart sounds. No murmur heard.    No friction rub. No gallop.  Pulmonary:     Effort: Pulmonary effort is normal. No accessory muscle usage, prolonged expiration or respiratory distress.     Breath sounds: Normal breath sounds. No stridor, decreased air movement or transmitted upper airway sounds. No decreased breath sounds, wheezing, rhonchi or rales.  Chest:     Chest wall: No tenderness.  Musculoskeletal:        General: Normal range of motion.     Cervical back: Normal range of motion and neck supple. Normal range of motion.  Lymphadenopathy:     Cervical: No cervical adenopathy.  Skin:    General: Skin is warm and dry.     Findings: No erythema or rash.  Neurological:     General: No focal deficit present.     Mental Status: He is alert and oriented to person, place, and time.  Psychiatric:        Mood and Affect: Mood normal.        Behavior: Behavior normal.     Visual Acuity Right Eye Distance:   Left Eye Distance:   Bilateral Distance:    Right Eye Near:   Left Eye Near:    Bilateral Near:     UC Couse / Diagnostics / Procedures:     Radiology No results found.  Procedures Procedures (including critical care  time) EKG  Pending results:  Labs Reviewed  SARS CORONAVIRUS 2 (TAT 6-24 HRS)  POC INFLUENZA A AND B ANTIGEN (URGENT CARE ONLY)    Medications Ordered in UC: Medications - No data to display  UC Diagnoses / Final Clinical Impressions(s)   I have reviewed the triage vital signs and the nursing notes.  Pertinent labs & imaging results that were available during my care of the patient were reviewed by me and considered in my medical decision making (see chart for details).    Final diagnoses:  Viral URI with cough   Rapid flu test today was negative, COVID-19 test pending.  Patient would benefit from Paxlovid if positive due to duration of symptoms and history of hypertension as well as morbid obesity.  Last GFR was performed in January 2023 and was 76.  Supportive medications prescribed.  Conservative care recommended.  Return precautions advised. Please see discharge instructions below for further details of plan of care as provided to patient. ED Prescriptions     Medication Sig Dispense Auth. Provider   cetirizine (ZYRTEC ALLERGY) 10 MG tablet Take 1 tablet (10 mg total) by mouth at bedtime. 90 tablet Lynden Oxford Scales, PA-C   fluticasone (FLONASE) 50 MCG/ACT nasal spray Place 1 spray into both nostrils daily. Begin by using 2 sprays in each nare daily for 3 to 5 days, then decrease to 1 spray in each nare daily. 15.8 mL Lynden Oxford Scales, PA-C   promethazine-dextromethorphan (PROMETHAZINE-DM) 6.25-15 MG/5ML syrup Take 5 mLs by mouth at bedtime as needed for cough. 60 mL Lynden Oxford Scales, PA-C      PDMP not reviewed this encounter.  Disposition Upon Discharge:  Condition: stable for discharge home Home: take medications as prescribed; routine discharge instructions as discussed; follow up as advised.  Patient presented with an acute illness with associated systemic symptoms and significant discomfort requiring urgent management. In my opinion, this is a condition  that a prudent lay person (  someone who possesses an average knowledge of health and medicine) may potentially expect to result in complications if not addressed urgently such as respiratory distress, impairment of bodily function or dysfunction of bodily organs.   Routine symptom specific, illness specific and/or disease specific instructions were discussed with the patient and/or caregiver at length.   As such, the patient has been evaluated and assessed, work-up was performed and treatment was provided in alignment with urgent care protocols and evidence based medicine.  Patient/parent/caregiver has been advised that the patient may require follow up for further testing and treatment if the symptoms continue in spite of treatment, as clinically indicated and appropriate.  If the patient was tested for COVID-19, Influenza and/or RSV, then the patient/parent/guardian was advised to isolate at home pending the results of his/her diagnostic coronavirus test and potentially longer if they're positive. I have also advised pt that if his/her COVID-19 test returns positive, it's recommended to self-isolate for at least 10 days after symptoms first appeared AND until fever-free for 24 hours without fever reducer AND other symptoms have improved or resolved. Discussed self-isolation recommendations as well as instructions for household member/close contacts as per the [facility] and [place] DHHS, and also gave patient the [facility] packet with this information.  Patient/parent/caregiver has been advised to return to the [facility] or PCP in 3-5 days if no better; to PCP or the Emergency Department if new signs and symptoms develop, or if the current signs or symptoms continue to change or worsen for further workup, evaluation and treatment as clinically indicated and appropriate  The patient will follow up with their current PCP if and as advised. If the patient does not currently have a PCP we will assist them in obtaining one.   The  patient may need specialty follow up if the symptoms continue, in spite of conservative treatment and management, for further workup, evaluation, consultation and treatment as clinically indicated and appropriate.  Patient/parent/caregiver verbalized understanding and agreement of plan as discussed.  All questions were addressed during visit.  Please see discharge instructions below for further details of plan.  Discharge Instructions:   Discharge Instructions      Your rapid influenza antigen test today was negative.  No further influenza testing is indicated.     If your COVID-19 PCR test is positive, you will be contacted by phone.  Please discuss with the callback nurse whether or not you would benefit from antiviral therapy treatment for COVID-19.   If your COVID-19 PCR test is negative, please consider retesting in the next 2 to 3 days, particularly if you are not feeling any better.  You are welcome to return here to urgent care to have it done or you can take a home COVID-19 test.   If both your COVID-19 tests are negative, then you can safely assume that your illness is due to one of the many less serious illnesses circulating in our community right now.     Conservative care is recommended with rest, drinking plenty of clear fluids, eating only when hungry, taking supportive medications for your symptoms and avoiding being around other people.  Please remain at home until you are fever free for 24 hours without the use of antifever medications such as Tylenol and ibuprofen.   Based on my physical exam findings and the history you have provided  today, I do not recommend antibiotics at this time.  I do not believe the risks and side effects of antibiotics would outweigh any minimal benefit  that they might provide.       Please read below to learn more about the medications, dosages and frequencies that I recommend to help alleviate your symptoms and to get you feeling better soon:    Zyrtec (cetirizine): This is an excellent second-generation antihistamine that helps to reduce respiratory inflammatory response to environmental allergens.  In some patients, this medication can cause daytime sleepiness so I recommend that you take 1 tablet daily at bedtime.     Flonase (fluticasone): This is a steroid nasal spray that used once daily, 1 spray in each nare.  This works best when used on a daily basis. This medication does not work well if it is only used when you think you need it.  After 3 to 5 days of use, you will notice significant reduction of the inflammation and mucus production that is currently being caused by exposure to allergens, whether seasonal or environmental.  The most common side effect of this medication is nosebleeds.  If you experience a nosebleed, please discontinue use for 1 week, then feel free to resume.  If you find that your insurance will not pay for this medication, please consider a different nasal steroids such as Nasonex (mometasone), or Nasacort (triamcinolone).    Promethazine DM: Promethazine is both a nasal decongestant and an antinausea medication that makes most patients feel fairly sleepy.  The DM is dextromethorphan, a cough suppressant found in many over-the-counter cough medications.  Please take 5 mL before bedtime to minimize your cough which will help you sleep better.  I have sent a prescription for this medication to your pharmacy.   Please follow-up within the next 5-7 days either with your primary care provider or urgent care if your symptoms do not resolve.  If you do not have a primary care provider, we will assist you in finding one.        Thank you for visiting urgent care today.  We appreciate the opportunity to participate in your care.       This office note has been dictated using Museum/gallery curator.  Unfortunately, this method of dictation can sometimes lead to typographical or grammatical errors.  I  apologize for your inconvenience in advance if this occurs.  Please do not hesitate to reach out to me if clarification is needed.      Lynden Oxford Scales, PA-C 10/24/22 1517

## 2022-10-25 LAB — SARS CORONAVIRUS 2 (TAT 6-24 HRS): SARS Coronavirus 2: NEGATIVE

## 2023-01-24 ENCOUNTER — Other Ambulatory Visit: Payer: Self-pay | Admitting: Internal Medicine

## 2023-01-25 LAB — CBC
HCT: 48.6 % (ref 38.5–50.0)
Hemoglobin: 16.9 g/dL (ref 13.2–17.1)
MCH: 31.6 pg (ref 27.0–33.0)
MCHC: 34.8 g/dL (ref 32.0–36.0)
MCV: 90.8 fL (ref 80.0–100.0)
MPV: 10.3 fL (ref 7.5–12.5)
Platelets: 213 10*3/uL (ref 140–400)
RBC: 5.35 10*6/uL (ref 4.20–5.80)
RDW: 11.9 % (ref 11.0–15.0)
WBC: 7.4 10*3/uL (ref 3.8–10.8)

## 2023-01-25 LAB — COMPLETE METABOLIC PANEL WITH GFR
AG Ratio: 1.5 (calc) (ref 1.0–2.5)
ALT: 20 U/L (ref 9–46)
AST: 23 U/L (ref 10–35)
Albumin: 4.6 g/dL (ref 3.6–5.1)
Alkaline phosphatase (APISO): 62 U/L (ref 35–144)
BUN: 8 mg/dL (ref 7–25)
CO2: 22 mmol/L (ref 20–32)
Calcium: 10.2 mg/dL (ref 8.6–10.3)
Chloride: 102 mmol/L (ref 98–110)
Creat: 0.85 mg/dL (ref 0.70–1.30)
Globulin: 3 g/dL (calc) (ref 1.9–3.7)
Glucose, Bld: 137 mg/dL — ABNORMAL HIGH (ref 65–99)
Potassium: 4 mmol/L (ref 3.5–5.3)
Sodium: 139 mmol/L (ref 135–146)
Total Bilirubin: 0.9 mg/dL (ref 0.2–1.2)
Total Protein: 7.6 g/dL (ref 6.1–8.1)
eGFR: 101 mL/min/{1.73_m2} (ref >=60)

## 2023-01-25 LAB — LIPID PANEL
Cholesterol: 215 mg/dL — ABNORMAL HIGH (ref <200)
HDL: 64 mg/dL (ref >=40)
LDL Cholesterol (Calc): 128 mg/dL (calc) — ABNORMAL HIGH
Non-HDL Cholesterol (Calc): 151 mg/dL (calc) — ABNORMAL HIGH (ref <130)
Total CHOL/HDL Ratio: 3.4 (calc) (ref <5.0)
Triglycerides: 121 mg/dL (ref <150)

## 2023-01-25 LAB — TSH: TSH: 1.84 mIU/L (ref 0.40–4.50)

## 2023-01-25 LAB — VITAMIN D 25 HYDROXY (VIT D DEFICIENCY, FRACTURES): Vit D, 25-Hydroxy: 30 ng/mL (ref 30–100)

## 2023-01-25 LAB — PSA: PSA: 0.14 ng/mL (ref <=4.00)

## 2023-03-04 IMAGING — CT CT ABD-PELV W/ CM
2 of 5 series · 16 of 46 positions shown, 18 images · IV contrast (Omni 300)
Comparison: None.

CLINICAL DATA: Diffuse abdominal pain and bloody stools which began
yesterday.

EXAM:
CT ABDOMEN AND PELVIS WITH CONTRAST
TECHNIQUE: Multidetector CT imaging of the abdomen and pelvis was performed
using the standard protocol following bolus administration of
intravenous contrast.
CONTRAST:  100mL OMNIPAQUE IOHEXOL 300 MG/ML  SOLN

[Series 3: a/p w/ 5mm · axial · 0.98mm/px · z∈[+713,+1138]mm · 13 of 95 slices shown, 15 images]
[im 5/95  soft-tissue]
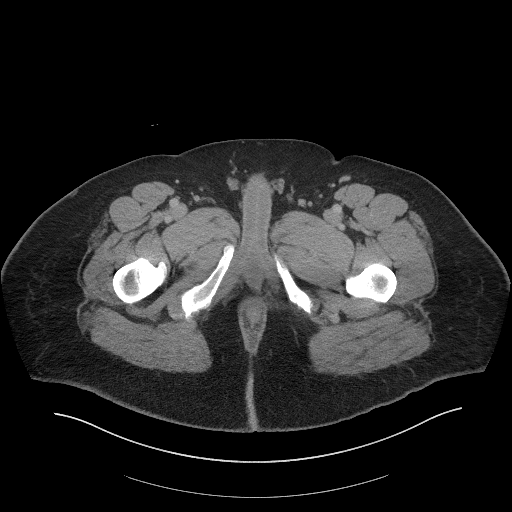
[im 5/95  bone]
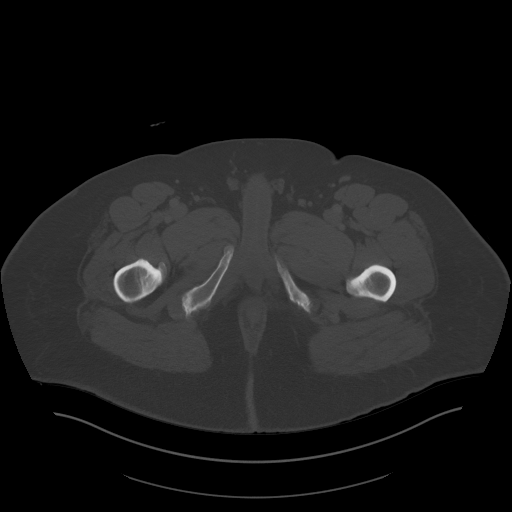
[im 15/95  soft-tissue]
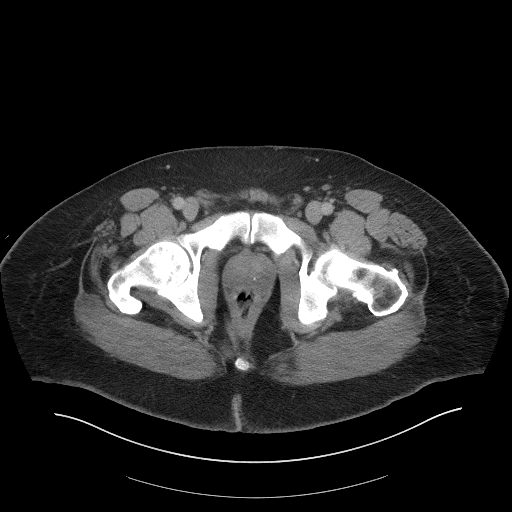
[im 20/95  soft-tissue]
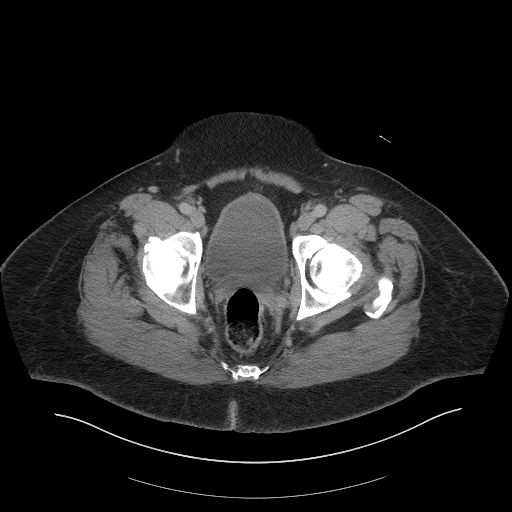
[im 25/95  soft-tissue]
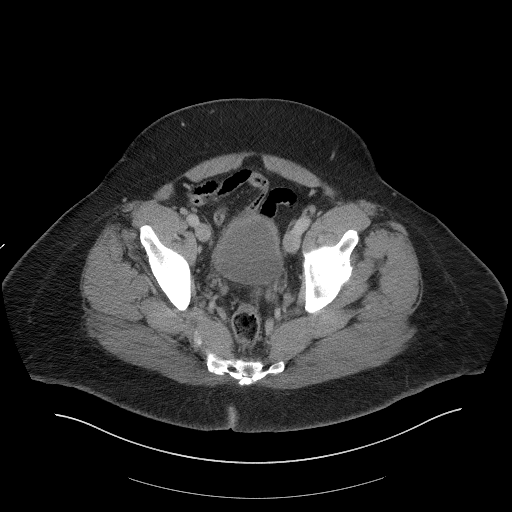
[im 35/95  soft-tissue]
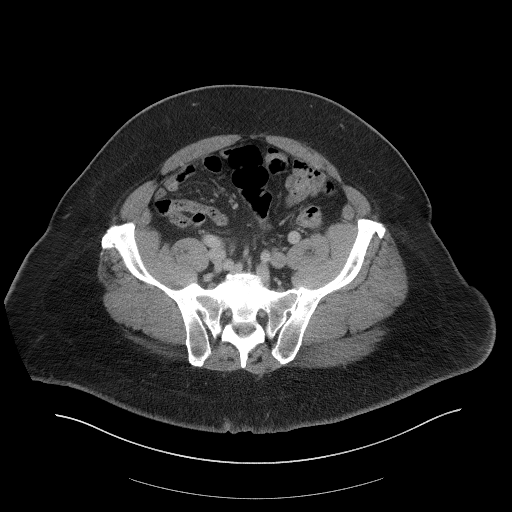
[im 40/95  soft-tissue]
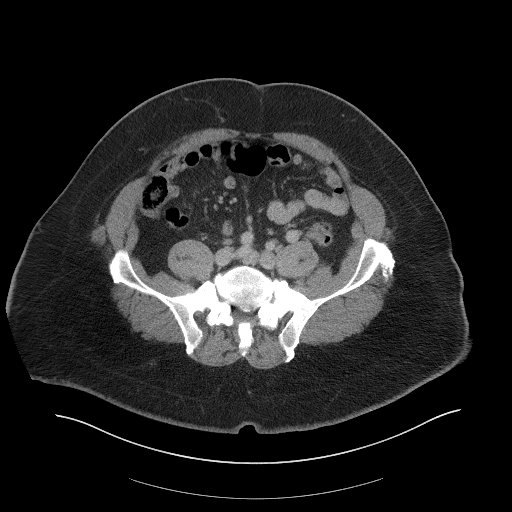
[im 50/95  soft-tissue]
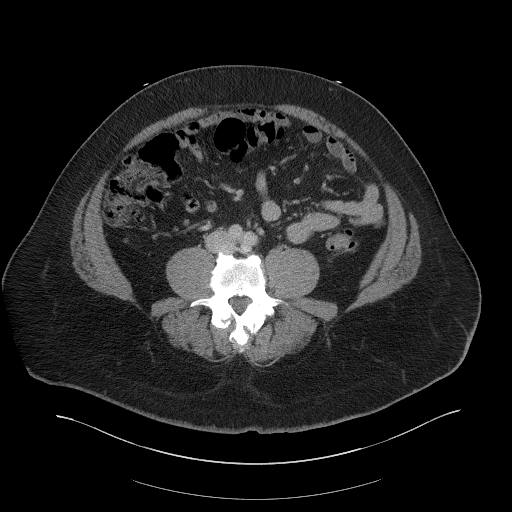
[im 55/95  soft-tissue]
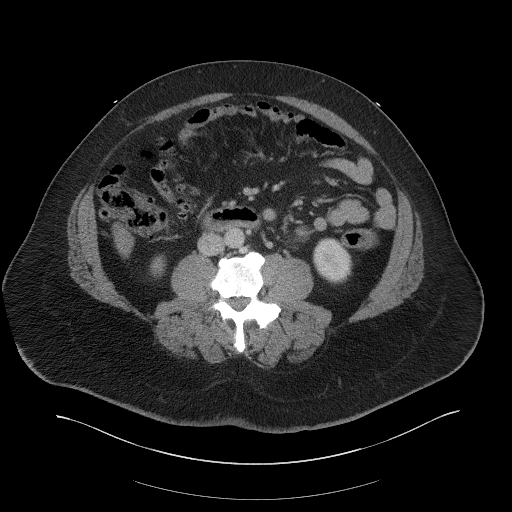
[im 60/95  soft-tissue]
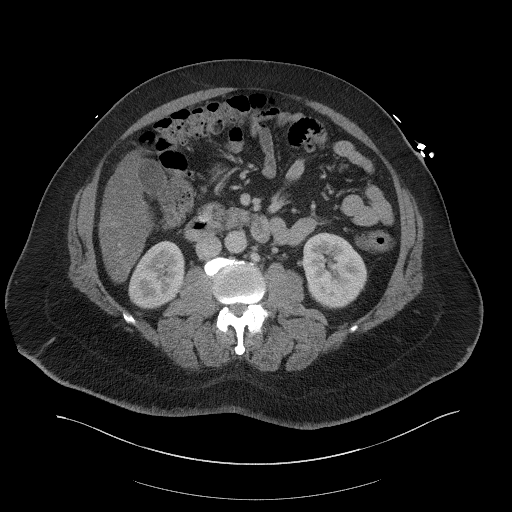
[im 60/95  bone]
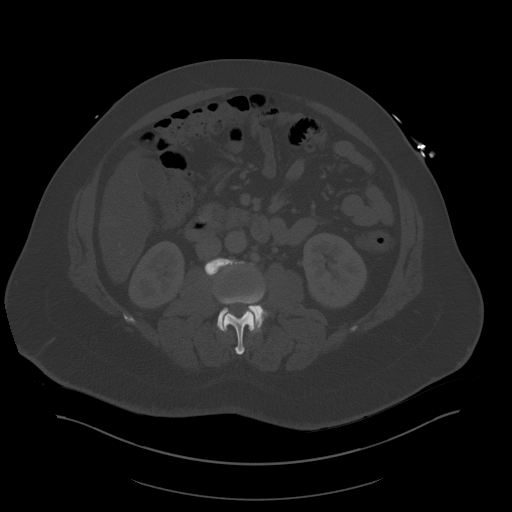
[im 70/95  soft-tissue]
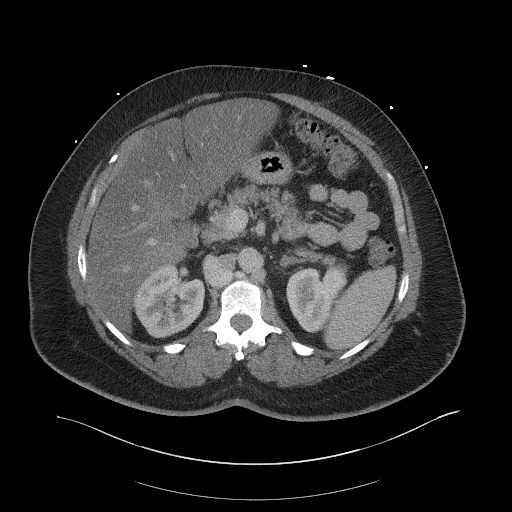
[im 75/95  soft-tissue]
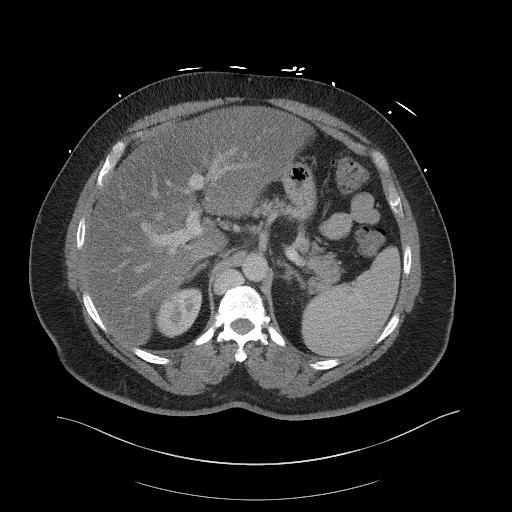
[im 80/95  soft-tissue]
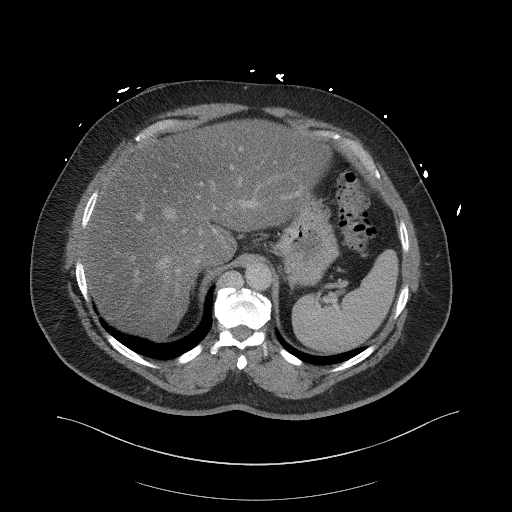
[im 90/95  soft-tissue]
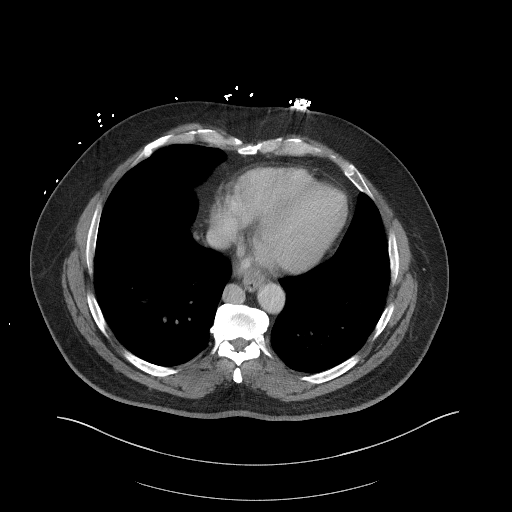

[Series 6: a/p w/ cor · coronal · 0.89mm/px · 3 of 154 slices shown]
[im 52/154  soft-tissue]
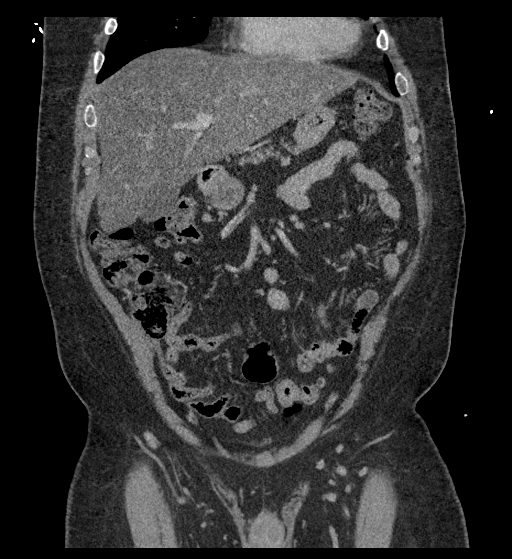
[im 69/154  soft-tissue]
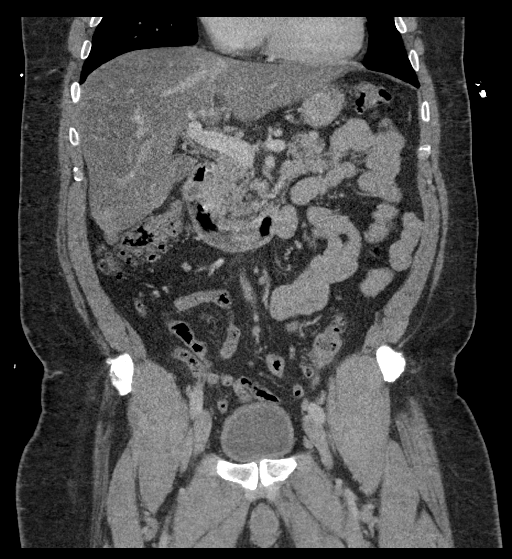
[im 86/154  soft-tissue]
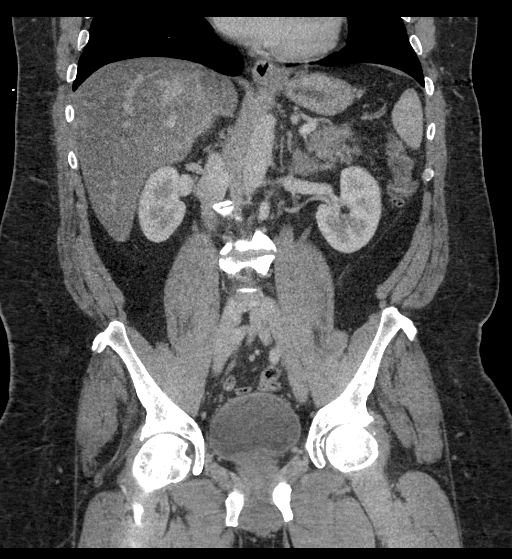

[16 of 46 positions shown; findings below may reference images not displayed]

FINDINGS: Lower chest: No acute abnormality.

Hepatobiliary: Diffuse hepatic steatosis. Hypertrophy of the lateral
segment of left hepatic lobe is noted. Inferior margin of the right
hepatic lobe appears slightly irregular and nodular. Gallbladder
appears normal. No bile duct dilatation.

Pancreas: Unremarkable. No pancreatic ductal dilatation or
surrounding inflammatory changes.

Spleen: Normal in size without focal abnormality.

Adrenals/Urinary Tract: Normal adrenal glands. No nephrolithiasis,
hydronephrosis or mass. Urinary bladder is unremarkable.

Stomach/Bowel: The stomach appears within normal limits. The
appendix is visualized and is normal. No bowel wall thickening,
inflammation or distension. Scattered colonic diverticula noted
without signs of acute diverticulitis.

Vascular/Lymphatic: There is as azygous continuation of the IVC.
Normal appearance of the abdominal aorta. No abdominopelvic
adenopathy.

Reproductive: Prostate is unremarkable.

Other: Small fat containing umbilical hernia. No free fluid or fluid
collections within the abdomen or pelvis.

Musculoskeletal: No acute or significant osseous findings. Lumbar
degenerative disc disease is noted. This is most advanced at L4-5
and L5-S1.
IMPRESSION: 1. No acute findings identified within the abdomen or pelvis.
2. Diffuse hepatic steatosis with morphologic features of the liver
suggestive of early cirrhosis.
3. Azygous continuation of the IVC.

## 2023-03-07 ENCOUNTER — Encounter (HOSPITAL_COMMUNITY): Payer: Self-pay

## 2023-03-07 ENCOUNTER — Ambulatory Visit (HOSPITAL_COMMUNITY)
Admission: EM | Admit: 2023-03-07 | Discharge: 2023-03-07 | Disposition: A | Discharge status: Home / Self Care | Payer: BC Managed Care – PPO | Attending: Family Medicine | Admitting: Family Medicine

## 2023-03-07 DIAGNOSIS — B9789 Other viral agents as the cause of diseases classified elsewhere: Secondary | ICD-10-CM | POA: Insufficient documentation

## 2023-03-07 DIAGNOSIS — R059 Cough, unspecified: Secondary | ICD-10-CM | POA: Diagnosis not present

## 2023-03-07 DIAGNOSIS — J069 Acute upper respiratory infection, unspecified: Secondary | ICD-10-CM | POA: Diagnosis present

## 2023-03-07 DIAGNOSIS — Z1152 Encounter for screening for COVID-19: Secondary | ICD-10-CM | POA: Diagnosis present

## 2023-03-07 LAB — SARS CORONAVIRUS 2 (TAT 6-24 HRS): SARS Coronavirus 2: NEGATIVE

## 2023-03-07 MED ORDER — PROMETHAZINE-DM 6.25-15 MG/5ML PO SYRP: 5.0000 mL | ORAL_SOLUTION | Freq: Three times a day (TID) | ORAL | 0 refills | Status: DC | PRN

## 2023-03-07 NOTE — ED Provider Notes (Signed)
[facility]    CSN: [id number] Arrival date & time: 03/07/23  4259      History   Chief Complaint Chief Complaint  Patient presents with   Cough    HPI Louis Gibson is a 57 y.o. male.   HPI Patient presents with URI symptoms including cough, sore throat, otalgia, nasal congestion, runny nose, and fatigue. Several co-workers are out with COVID. He is concerned for possible exposure.  Patient has risk factors for complicated COVID due to COPD, ongoing tobacco use, and Type 2 diabetes.  He is here requesting COVID testing today.  He denies any shortness of breath, wheezing or chest tightness. Past Medical History:  Diagnosis Date   Arthritis    Diabetes mellitus without complication (HCC)    Hypertension     Patient Active Problem List   Diagnosis Date Noted   Lower GI bleeding 08/19/2021   COPD with acute exacerbation (HCC) 08/18/2021   Alcohol abuse 08/18/2021   Hepatic steatosis 08/18/2021   GI bleed 08/18/2021   Diabetes mellitus type 2 in obese 08/18/2021   Chest pain 08/11/2015   Essential hypertension 08/11/2015   Tobacco abuse 08/11/2015   Retrosternal chest pain 08/10/2015    Past Surgical History:  Procedure Laterality Date   BIOPSY  08/19/2021   Procedure: BIOPSY;  Surgeon: Jeani Hawking, MD;  Location: [facility] ENDOSCOPY;  Service: Endoscopy;;   COLONOSCOPY WITH PROPOFOL N/A 08/19/2021   Procedure: COLONOSCOPY WITH PROPOFOL;  Surgeon: Jeani Hawking, MD;  Location: [facility] ENDOSCOPY;  Service: Endoscopy;  Laterality: N/A;   ESOPHAGOGASTRODUODENOSCOPY (EGD) WITH PROPOFOL N/A 08/19/2021   Procedure: ESOPHAGOGASTRODUODENOSCOPY (EGD) WITH PROPOFOL;  Surgeon: Jeani Hawking, MD;  Location: [facility] ENDOSCOPY;  Service: Endoscopy;  Laterality: N/A;   POLYPECTOMY  08/19/2021   Procedure: POLYPECTOMY;  Surgeon: Jeani Hawking, MD;  Location: [facility] ENDOSCOPY;  Service: Endoscopy;;       Home Medications    Prior to Admission medications   Medication Sig Start Date  End Date Taking? Authorizing Provider  amLODipine (NORVASC) 10 MG tablet Take 10 mg by mouth daily. 05/28/21  Yes [provider]  cetirizine (ZYRTEC ALLERGY) 10 MG tablet Take 1 tablet (10 mg total) by mouth at bedtime. 10/24/22 04/22/23 Yes Theadora Rama Scales, PA-C  cholecalciferol (VITAMIN D3) 25 MCG (1000 UNIT) tablet Take 1,000 Units by mouth daily.   Yes [provider]  fluticasone (FLONASE) 50 MCG/ACT nasal spray Place 1 spray into both nostrils daily. Begin by using 2 sprays in each nare daily for 3 to 5 days, then decrease to 1 spray in each nare daily. 10/24/22  Yes Theadora Rama Scales, PA-C  folic acid (FOLVITE) 800 MCG tablet Take 800 mcg by mouth daily.   Yes [provider]  gabapentin (NEURONTIN) 100 MG capsule Take 100 mg by mouth daily. 04/25/21  Yes [provider]  glimepiride (AMARYL) 4 MG tablet Take 4 mg by mouth daily. 11/21/20  Yes [provider]  hydrochlorothiazide (HYDRODIURIL) 12.5 MG tablet Take 12.5 mg by mouth daily. 11/09/20  Yes [provider]  JANUVIA 100 MG tablet Take 100 mg by mouth daily. 11/21/20  Yes [provider]  pantoprazole (PROTONIX) 20 MG tablet Take 2 tablets (40 mg total) by mouth daily. 08/21/21 03/07/23 Yes Pieter Partridge, MD  thiamine (VITAMIN B-1) 100 MG tablet Take 100 mg by mouth daily.   Yes [provider]  metFORMIN (GLUCOPHAGE) 500 MG tablet Take 1 tablet (500 mg total) by mouth 2 (two) times daily for  21 days. 10/15/19 11/05/19  Caccavale, Sophia, PA-C  Misc Natural Products (GLUCOSAMINE CHONDROITIN COMPLX) TABS Take 1 tablet by mouth 2 (two) times daily.    [provider]  promethazine-dextromethorphan (PROMETHAZINE-DM) 6.25-15 MG/5ML syrup Take 5 mLs by mouth at bedtime as needed for cough. 10/24/22   Theadora Rama Scales, PA-C  Vitamin D, Ergocalciferol, (DRISDOL) 1.25 MG (50000 UNIT) CAPS capsule Take by mouth. 11/22/20   [provider]     Family History Family History  Problem Relation Age of Onset   Hypertension Brother    CAD Maternal Uncle    Diabetes Mellitus II Maternal Uncle    Kidney disease Brother     Social History Social History   Tobacco Use   Smoking status: Every Day    Types: Cigars   Smokeless tobacco: Never   Tobacco comments:    1 -2 cigars a day.  Substance Use Topics   Alcohol use: Yes    Comment: 2 - 4 beers on the weekend.   Drug use: No     Allergies   Egg-derived products   Review of Systems Review of Systems Pertinent negatives listed in HPI  Physical Exam Triage Vital Signs ED Triage Vitals  Encounter Vitals Group     BP 03/07/23 0951 (!) 163/96     Systolic BP Percentile --      Diastolic BP Percentile --      Pulse Rate 03/07/23 0951 86     Resp 03/07/23 0951 16     Temp 03/07/23 0951 98.8 F (37.1 C)     Temp Source 03/07/23 0951 Oral     SpO2 03/07/23 0951 96 %     Weight 03/07/23 0950 285 lb (129.3 kg)     Height 03/07/23 0950 6' (1.829 m)     Head Circumference --      Peak Flow --      Pain Score 03/07/23 0950 0     Pain Loc --      Pain Education --      Exclude from Growth Chart --    No data found.  Updated Vital Signs BP (!) 163/96 (BP Location: Right Arm)   Pulse 86   Temp 98.8 F (37.1 C) (Oral)   Resp 16   Ht 6' (1.829 m)   Wt 285 lb (129.3 kg)   SpO2 96%   BMI 38.65 kg/m   Visual Acuity Right Eye Distance:   Left Eye Distance:   Bilateral Distance:    Right Eye Near:   Left Eye Near:    Bilateral Near:     Physical Exam Constitutional:      Appearance: Normal appearance. He is ill-appearing.  HENT:     Head: Normocephalic and atraumatic.     Right Ear: External ear normal. There is no impacted cerumen.     Left Ear: External ear normal. There is no impacted cerumen.     Nose: Congestion and rhinorrhea present.     Mouth/Throat:     Mouth: Mucous membranes are moist.  Eyes:     Extraocular Movements: Extraocular  movements intact.     Conjunctiva/sclera: Conjunctivae normal.     Pupils: Pupils are equal, round, and reactive to light.  Cardiovascular:     Rate and Rhythm: Normal rate and regular rhythm.  Pulmonary:     Effort: Pulmonary effort is normal.     Breath sounds: Normal breath sounds.  Musculoskeletal:     Cervical back: Normal range of  motion.  Neurological:     General: No focal deficit present.     Mental Status: He is alert.      UC Treatments / Results  Labs (all labs ordered are listed, but only abnormal results are displayed) Labs Reviewed  SARS CORONAVIRUS 2 (TAT 6-24 HRS)    EKG   Radiology No results found.  Procedures Procedures (including critical care time)  Medications Ordered in UC Medications - No data to display  Initial Impression / Assessment and Plan / UC Course  I have reviewed the triage vital signs and the nursing notes.  Pertinent labs & imaging results that were available during my care of the patient were reviewed by me and considered in my medical decision making (see chart for details).    Viral URI with cough, recent exposure to COVID-19 at work, obtaining a COVID-19 PCR test.  Patient advised not to return to work given his current symptoms and he has high risk for complications due to COPD and type 2 diabetes. Symptom management indicated only.  Medications for discharge orders.  Strict return precautions if symptoms worsen do not improve. Final Clinical Impressions(s) / UC Diagnoses   Final diagnoses:  Viral URI with cough  Encounter for screening laboratory testing for COVID-19 virus   Discharge Instructions   None    ED Prescriptions   None    PDMP not reviewed this encounter.   Bing Neighbors, NP 03/07/23 1035

## 2023-03-07 NOTE — Discharge Instructions (Addendum)
I have reset your password for your MyChart:  UserName:[email or web address]  Password: [phone] (log in and change your password)   COVID test will result within 24 hours.  As discussed use Flonase for nasal symptoms and I have sent over Promethazine DM which can help with the cough along with your nasal symptoms.  Hydrate well with fluids and get rest.  If you develop body aches or fever take Tylenol.

## 2023-03-07 NOTE — ED Triage Notes (Signed)
Patient here today with c/o cough, runny nose, sneeze, and fatigue since yesterday. He has been taking Tylenol. Some of his coworkers have been out of work with [employer].

## 2023-03-13 LAB — COLOGUARD: COLOGUARD: NEGATIVE

## 2023-03-13 LAB — EXTERNAL GENERIC LAB PROCEDURE: COLOGUARD: NEGATIVE

## 2023-08-29 ENCOUNTER — Other Ambulatory Visit: Payer: Self-pay

## 2023-08-29 ENCOUNTER — Emergency Department (HOSPITAL_COMMUNITY): Payer: BC Managed Care – PPO

## 2023-08-29 ENCOUNTER — Emergency Department (HOSPITAL_COMMUNITY)
Admission: EM | Admit: 2023-08-29 | Discharge: 2023-08-29 | Disposition: A | Discharge status: Home / Self Care | Payer: BC Managed Care – PPO | Attending: Emergency Medicine | Admitting: Emergency Medicine

## 2023-08-29 DIAGNOSIS — S0081XA Abrasion of other part of head, initial encounter: Secondary | ICD-10-CM | POA: Diagnosis not present

## 2023-08-29 DIAGNOSIS — Y909 Presence of alcohol in blood, level not specified: Secondary | ICD-10-CM | POA: Diagnosis not present

## 2023-08-29 DIAGNOSIS — Y92009 Unspecified place in unspecified non-institutional (private) residence as the place of occurrence of the external cause: Secondary | ICD-10-CM | POA: Diagnosis not present

## 2023-08-29 DIAGNOSIS — S20211A Contusion of right front wall of thorax, initial encounter: Secondary | ICD-10-CM | POA: Diagnosis not present

## 2023-08-29 DIAGNOSIS — S50312A Abrasion of left elbow, initial encounter: Secondary | ICD-10-CM | POA: Insufficient documentation

## 2023-08-29 DIAGNOSIS — W19XXXA Unspecified fall, initial encounter: Secondary | ICD-10-CM

## 2023-08-29 DIAGNOSIS — F1090 Alcohol use, unspecified, uncomplicated: Secondary | ICD-10-CM | POA: Insufficient documentation

## 2023-08-29 DIAGNOSIS — S0990XA Unspecified injury of head, initial encounter: Secondary | ICD-10-CM

## 2023-08-29 DIAGNOSIS — W0110XA Fall on same level from slipping, tripping and stumbling with subsequent striking against unspecified object, initial encounter: Secondary | ICD-10-CM | POA: Diagnosis not present

## 2023-08-29 DIAGNOSIS — R21 Rash and other nonspecific skin eruption: Secondary | ICD-10-CM | POA: Insufficient documentation

## 2023-08-29 DIAGNOSIS — Z789 Other specified health status: Secondary | ICD-10-CM

## 2023-08-29 LAB — CBC WITH DIFFERENTIAL/PLATELET
Abs Immature Granulocytes: 0.04 10*3/uL (ref 0.00–0.07)
Basophils Absolute: 0 10*3/uL (ref 0.0–0.1)
Basophils Relative: 0 %
Eosinophils Absolute: 0 10*3/uL (ref 0.0–0.5)
Eosinophils Relative: 0 %
HCT: 52 % (ref 39.0–52.0)
Hemoglobin: 18.3 g/dL — ABNORMAL HIGH (ref 13.0–17.0)
Immature Granulocytes: 0 %
Lymphocytes Relative: 7 %
Lymphs Abs: 0.7 10*3/uL (ref 0.7–4.0)
MCH: 31.7 pg (ref 26.0–34.0)
MCHC: 35.2 g/dL (ref 30.0–36.0)
MCV: 90 fL (ref 80.0–100.0)
Monocytes Absolute: 0.5 10*3/uL (ref 0.1–1.0)
Monocytes Relative: 6 %
Neutro Abs: 7.9 10*3/uL — ABNORMAL HIGH (ref 1.7–7.7)
Neutrophils Relative %: 87 %
Platelets: 219 10*3/uL (ref 150–400)
RBC: 5.78 MIL/uL (ref 4.22–5.81)
RDW: 11.8 % (ref 11.5–15.5)
WBC: 9.2 10*3/uL (ref 4.0–10.5)
nRBC: 0 % (ref 0.0–0.2)

## 2023-08-29 LAB — COMPREHENSIVE METABOLIC PANEL
ALT: 25 U/L (ref 0–44)
AST: 28 U/L (ref 15–41)
Albumin: 4.1 g/dL (ref 3.5–5.0)
Alkaline Phosphatase: 80 U/L (ref 38–126)
Anion gap: 17 — ABNORMAL HIGH (ref 5–15)
BUN: 7 mg/dL (ref 6–20)
CO2: 18 mmol/L — ABNORMAL LOW (ref 22–32)
Calcium: 9.5 mg/dL (ref 8.9–10.3)
Chloride: 101 mmol/L (ref 98–111)
Creatinine, Ser: 0.74 mg/dL (ref 0.61–1.24)
GFR, Estimated: >60 mL/min (ref >60)
Glucose, Bld: 248 mg/dL — ABNORMAL HIGH (ref 70–99)
Potassium: 3.5 mmol/L (ref 3.5–5.1)
Sodium: 136 mmol/L (ref 135–145)
Total Bilirubin: 1 mg/dL (ref 0.0–1.2)
Total Protein: 7.8 g/dL (ref 6.5–8.1)

## 2023-08-29 MED ORDER — ACETAMINOPHEN 500 MG PO TABS
1000.0000 mg | ORAL_TABLET | Freq: Once | ORAL | Status: AC
  Administered 2023-08-29: 1000 mg via ORAL
  Filled 2023-08-29: qty 2

## 2023-08-29 NOTE — ED Triage Notes (Signed)
 Patient tripped and fell at home this evening , hit his head against a portable heater , presents with headache , right lateral ribcage pain and skin abrasions at scalp and left hand . Alert and oriented , respirations unlabored. No anticoagulant medication .

## 2023-08-29 NOTE — Discharge Instructions (Signed)
 Use spirometer every few hours while awake to ensure you are taking normal breaths. Use Tylenol  every 4 hours as needed for pain and ice pack as needed. Stay well-hydrated with water and avoid alcohol use. Have family around the next few days to help support you. Return for new concerns such as worsening pain, difficulty breathing, persistent vomiting or new concerns.

## 2023-08-29 NOTE — ED Provider Notes (Signed)
 [place] [facility] Provider Note   CSN: [id number] Arrival date & time: 08/29/23  0033     History  Chief Complaint  Patient presents with   Louis    [name] is a 58 y.o. male.  Patient presents for assessment after mechanical fall tripping on a rug at home causing him to hit left elbow, head and right ribs.  Patient thinks possibly brief syncope after but recalls falling.  Patient admits to using alcohol before the event.  Pain with pushing on the right flank area and abrasion to the head and elbow.  No blood thinner use.  The history is provided by the patient.       Home Medications Prior to Admission medications   Medication Sig Start Date End Date Taking? Authorizing Provider  amLODipine  (NORVASC ) 10 MG tablet Take 10 mg by mouth daily. 05/28/21   [provider]  cetirizine  (ZYRTEC  ALLERGY) 10 MG tablet Take 1 tablet (10 mg total) by mouth at bedtime. 10/24/22 04/22/23  Joesph Shaver Scales, PA-C  cholecalciferol (VITAMIN D3) 25 MCG (1000 UNIT) tablet Take 1,000 Units by mouth daily.    [provider]  fluticasone  (FLONASE ) 50 MCG/ACT nasal spray Place 1 spray into both nostrils daily. Begin by using 2 sprays in each nare daily for 3 to 5 days, then decrease to 1 spray in each nare daily. 10/24/22   Joesph Shaver Scales, PA-C  folic acid  (FOLVITE ) 800 MCG tablet Take 800 mcg by mouth daily.    [provider]  gabapentin (NEURONTIN) 100 MG capsule Take 100 mg by mouth daily. 04/25/21   [provider]  glimepiride (AMARYL) 4 MG tablet Take 4 mg by mouth daily. 11/21/20   [provider]  hydrochlorothiazide  (HYDRODIURIL ) 12.5 MG tablet Take 12.5 mg by mouth daily. 11/09/20   [provider]  JANUVIA 100 MG tablet Take 100 mg by mouth daily. 11/21/20   [provider]  metFORMIN  (GLUCOPHAGE ) 500 MG tablet Take 1 tablet (500 mg total) by mouth 2 (two) times daily for 21 days.  10/15/19 11/05/19  Caccavale, Sophia, PA-C  Misc Natural Products (GLUCOSAMINE CHONDROITIN COMPLX) TABS Take 1 tablet by mouth 2 (two) times daily.    [provider]  pantoprazole  (PROTONIX ) 20 MG tablet Take 2 tablets (40 mg total) by mouth daily. 08/21/21 03/07/23  Chatterjee, Srobona Tublu, MD  promethazine -dextromethorphan (PROMETHAZINE -DM) 6.25-15 MG/5ML syrup Take 5 mLs by mouth 3 (three) times daily as needed for cough. 03/07/23   Arloa Suzen RAMAN, NP  thiamine  (VITAMIN B-1) 100 MG tablet Take 100 mg by mouth daily.    [provider]  Vitamin D , Ergocalciferol , (DRISDOL) 1.25 MG (50000 UNIT) CAPS capsule Take by mouth. 11/22/20   [provider]      Allergies    Egg-derived products    Review of Systems   Review of Systems  Constitutional:  Negative for chills and fever.  HENT:  Negative for congestion.   Eyes:  Negative for visual disturbance.  Respiratory:  Negative for shortness of breath.   Cardiovascular:  Negative for chest pain.  Gastrointestinal:  Negative for abdominal pain and vomiting.  Genitourinary:  Negative for dysuria and flank pain.  Musculoskeletal:  Positive for arthralgias. Negative for back pain, neck pain and neck stiffness.  Skin:  Positive for rash.  Neurological:  Positive for syncope and headaches. Negative for light-headedness.    Physical Exam Updated Vital Signs BP (!) 162/92   Pulse  89   Temp 98.3 F (36.8 C) (Oral)   Resp 18   SpO2 100%  Physical Exam Vitals and nursing note reviewed.  Constitutional:      General: He is not in acute distress.    Appearance: He is well-developed.  HENT:     Head: Normocephalic.     Comments: Patient has superficial abrasion upper forehead without step-off or hematoma.  No midline cervical tenderness full range of motion head neck without difficulty.  No trismus.  Mild paraspinal cervical tenderness.    Mouth/Throat:     Mouth: Mucous membranes are moist.  Eyes:     General:         Right eye: No discharge.        Left eye: No discharge.     Conjunctiva/sclera: Conjunctivae normal.  Neck:     Trachea: No tracheal deviation.  Cardiovascular:     Rate and Rhythm: Normal rate and regular rhythm.  Pulmonary:     Effort: Pulmonary effort is normal.     Breath sounds: Normal breath sounds.  Abdominal:     General: There is no distension.     Palpations: Abdomen is soft.     Tenderness: There is no abdominal tenderness. There is no guarding.  Musculoskeletal:        General: Tenderness present. Normal range of motion.     Cervical back: Normal range of motion and neck supple. Tenderness present. No rigidity.     Comments: Patient has no significant bony tenderness to all extremities flexion extension of major joints.  Patient can stand without significant difficulty.  No midline thoracic or lumbar tenderness.  Patient has superficial abrasion to left elbow olecranon without significant bony tenderness. Patient has tenderness to palpation of right mid flank lower rib area.  Skin:    General: Skin is warm.     Capillary Refill: Capillary refill takes less than 2 seconds.     Findings: Rash present.  Neurological:     General: No focal deficit present.     Mental Status: He is alert.     Cranial Nerves: No cranial nerve deficit.  Psychiatric:        Mood and Affect: Mood normal.     ED Results / Procedures / Treatments   Labs (all labs ordered are listed, but only abnormal results are displayed) Labs Reviewed  CBC WITH DIFFERENTIAL/PLATELET - Abnormal; Notable for the following components:      Result Value   Hemoglobin 18.3 (*)    Neutro Abs 7.9 (*)    All other components within normal limits  COMPREHENSIVE METABOLIC PANEL - Abnormal; Notable for the following components:   CO2 18 (*)    Glucose, Bld 248 (*)    Anion gap 17 (*)    All other components within normal limits    EKG EKG Interpretation Date/Time:  Wednesday August 29 2023 00:44:45  EST Ventricular Rate:  112 PR Interval:  172 QRS Duration:  86 QT Interval:  318 QTC Calculation: 434 R Axis:   84  Text Interpretation: Sinus tachycardia Septal infarct , age undetermined T wave abnormality, consider inferior ischemia Abnormal ECG When compared with ECG of 18-Aug-2021 07:45, PREVIOUS ECG IS PRESENT Confirmed by Tonia Chew [phone]) on 08/29/2023 10:28:07 AM  Radiology CT Head Wo Contrast Result Date: 08/29/2023 CLINICAL DATA:  Head trauma, moderate-severe. Fall. Hit head. Headache. EXAM: CT HEAD WITHOUT CONTRAST TECHNIQUE: Contiguous axial images were obtained from the base of the skull through the  vertex without intravenous contrast. RADIATION DOSE REDUCTION: This exam was performed according to the departmental dose-optimization program which includes automated exposure control, adjustment of the mA and/or kV according to patient size and/or use of iterative reconstruction technique. COMPARISON:  03/12/2008 FINDINGS: Brain: No acute intracranial abnormality. Specifically, no hemorrhage, hydrocephalus, mass lesion, acute infarction, or significant intracranial injury. Vascular: No hyperdense vessel or unexpected calcification. Skull: No acute calvarial abnormality. Sinuses/Orbits: No acute findings Other: None IMPRESSION: No acute intracranial abnormality. Electronically Signed   By: Franky Crease M.D.   On: 08/29/2023 01:19   DG Ribs Unilateral W/Chest Right Result Date: 08/29/2023 CLINICAL DATA:  Right rib pain after fall. EXAM: RIGHT RIBS AND CHEST - 3+ VIEW COMPARISON:  10/15/2019 FINDINGS: Normal cardiomediastinal silhouette. No focal consolidation, pleural effusion, or pneumothorax. No displaced rib fractures. IMPRESSION: No displaced rib fractures. Electronically Signed   By: Samudio Gatlin M.D.   On: 08/29/2023 01:07    Procedures Procedures    Medications Ordered in ED Medications  acetaminophen  (TYLENOL ) tablet 1,000 mg (has no administration in time range)    ED  Course/ Medical Decision Making/ A&P                                 Medical Decision Making Amount and/or Complexity of Data Reviewed Labs: ordered. Radiology: ordered.  Risk OTC drugs.   Patient presents with multiple musculoskeletal injuries from mechanical fall.  No concern for cardiac or metabolic abnormality as the cause.  Likely combination of age/alcohol use/mild dehydration leading to tripping on the rug.  Discussed the importance of getting rid of these rugs and avoiding alcohol use.  Patient understands and family in the room.  Patient has family to support him next few days.  Negative Nexus criteria no indication for CT scan of the neck.  With age, possible syncope after head injury and review of [place] CT head rules CT scan was ordered and reviewed results independently no acute fracture or bleeding.  X-ray of the ribs ordered and no obvious pneumothorax, no rib fractures with displacement.  Discussed may be occult rib fracture plan for spirometer discussed with nursing.  Tylenol  as needed for pain and ice.  Patient stable for discharge.  No abdominal significant tenderness to warrant CT scan at this time.  Reasons to return discussed.        Final Clinical Impression(s) / ED Diagnoses Final diagnoses:  Fall, initial encounter  Rib contusion, right, initial encounter  Alcohol use  Acute head injury, initial encounter    Rx / DC Orders ED Discharge Orders     None         Tonia Chew, MD 08/29/23 1036

## 2023-08-29 NOTE — ED Notes (Signed)
 Patient resting comfortably on stretcher at time of discharge. NAD. Respirations regular, even, and unlabored. Color appropriate. Discharge/follow up instructions reviewed with parents at bedside with no further questions. Understanding verbalized by parents.

## 2023-09-10 ENCOUNTER — Other Ambulatory Visit: Payer: Self-pay

## 2023-09-10 ENCOUNTER — Emergency Department (HOSPITAL_COMMUNITY): Payer: BC Managed Care – PPO

## 2023-09-10 ENCOUNTER — Encounter (HOSPITAL_COMMUNITY): Payer: Self-pay

## 2023-09-10 ENCOUNTER — Observation Stay (HOSPITAL_COMMUNITY)
Admission: EM | Admit: 2023-09-10 | Discharge: 2023-09-11 | Disposition: A | Discharge status: Home / Self Care | Payer: BC Managed Care – PPO | Attending: Internal Medicine | Admitting: Internal Medicine

## 2023-09-10 DIAGNOSIS — E1165 Type 2 diabetes mellitus with hyperglycemia: Secondary | ICD-10-CM | POA: Diagnosis not present

## 2023-09-10 DIAGNOSIS — F1010 Alcohol abuse, uncomplicated: Secondary | ICD-10-CM | POA: Diagnosis not present

## 2023-09-10 DIAGNOSIS — K920 Hematemesis: Principal | ICD-10-CM | POA: Insufficient documentation

## 2023-09-10 DIAGNOSIS — Z79899 Other long term (current) drug therapy: Secondary | ICD-10-CM | POA: Insufficient documentation

## 2023-09-10 DIAGNOSIS — J449 Chronic obstructive pulmonary disease, unspecified: Secondary | ICD-10-CM | POA: Insufficient documentation

## 2023-09-10 DIAGNOSIS — I10 Essential (primary) hypertension: Secondary | ICD-10-CM | POA: Diagnosis not present

## 2023-09-10 DIAGNOSIS — F17290 Nicotine dependence, other tobacco product, uncomplicated: Secondary | ICD-10-CM | POA: Insufficient documentation

## 2023-09-10 DIAGNOSIS — K625 Hemorrhage of anus and rectum: Secondary | ICD-10-CM | POA: Diagnosis present

## 2023-09-10 DIAGNOSIS — K5900 Constipation, unspecified: Secondary | ICD-10-CM | POA: Insufficient documentation

## 2023-09-10 DIAGNOSIS — K760 Fatty (change of) liver, not elsewhere classified: Secondary | ICD-10-CM | POA: Insufficient documentation

## 2023-09-10 DIAGNOSIS — K922 Gastrointestinal hemorrhage, unspecified: Secondary | ICD-10-CM | POA: Diagnosis not present

## 2023-09-10 LAB — CBC
HCT: 47.6 % (ref 39.0–52.0)
Hemoglobin: 16.7 g/dL (ref 13.0–17.0)
MCH: 31.7 pg (ref 26.0–34.0)
MCHC: 35.1 g/dL (ref 30.0–36.0)
MCV: 90.3 fL (ref 80.0–100.0)
Platelets: 237 10*3/uL (ref 150–400)
RBC: 5.27 MIL/uL (ref 4.22–5.81)
RDW: 11.4 % — ABNORMAL LOW (ref 11.5–15.5)
WBC: 8.8 10*3/uL (ref 4.0–10.5)
nRBC: 0 % (ref 0.0–0.2)

## 2023-09-10 LAB — HEMOGLOBIN A1C
Hgb A1c MFr Bld: 7.7 % — ABNORMAL HIGH (ref 4.8–5.6)
Mean Plasma Glucose: 174.29 mg/dL

## 2023-09-10 LAB — URINALYSIS, ROUTINE W REFLEX MICROSCOPIC
Bacteria, UA: NONE SEEN
Bilirubin Urine: NEGATIVE
Glucose, UA: >=500 mg/dL — AB
Hgb urine dipstick: NEGATIVE
Ketones, ur: NEGATIVE mg/dL
Leukocytes,Ua: NEGATIVE
Nitrite: NEGATIVE
Protein, ur: 30 mg/dL — AB
Specific Gravity, Urine: 1.02 (ref 1.005–1.030)
pH: 5 (ref 5.0–8.0)

## 2023-09-10 LAB — COMPREHENSIVE METABOLIC PANEL
ALT: 32 U/L (ref 0–44)
AST: 42 U/L — ABNORMAL HIGH (ref 15–41)
Albumin: 4.1 g/dL (ref 3.5–5.0)
Alkaline Phosphatase: 87 U/L (ref 38–126)
Anion gap: 13 (ref 5–15)
BUN: 8 mg/dL (ref 6–20)
CO2: 20 mmol/L — ABNORMAL LOW (ref 22–32)
Calcium: 9.8 mg/dL (ref 8.9–10.3)
Chloride: 100 mmol/L (ref 98–111)
Creatinine, Ser: 1.06 mg/dL (ref 0.61–1.24)
GFR, Estimated: >60 mL/min (ref >60)
Glucose, Bld: 279 mg/dL — ABNORMAL HIGH (ref 70–99)
Potassium: 3.9 mmol/L (ref 3.5–5.1)
Sodium: 133 mmol/L — ABNORMAL LOW (ref 135–145)
Total Bilirubin: 1 mg/dL (ref 0.0–1.2)
Total Protein: 7.7 g/dL (ref 6.5–8.1)

## 2023-09-10 LAB — HEMOGLOBIN AND HEMATOCRIT, BLOOD
HCT: 42.8 % (ref 39.0–52.0)
HCT: 43.3 % (ref 39.0–52.0)
Hemoglobin: 14.6 g/dL (ref 13.0–17.0)
Hemoglobin: 15.2 g/dL (ref 13.0–17.0)

## 2023-09-10 LAB — TYPE AND SCREEN
ABO/RH(D): B POS
Antibody Screen: NEGATIVE

## 2023-09-10 LAB — CBG MONITORING, ED
Glucose-Capillary: 190 mg/dL — ABNORMAL HIGH (ref 70–99)
Glucose-Capillary: 134 mg/dL — ABNORMAL HIGH (ref 70–99)

## 2023-09-10 LAB — HIV ANTIBODY (ROUTINE TESTING W REFLEX): HIV Screen 4th Generation wRfx: NONREACTIVE

## 2023-09-10 MED ORDER — FOLIC ACID 1 MG PO TABS
1.0000 mg | ORAL_TABLET | Freq: Every day | ORAL | Status: DC
  Administered 2023-09-10 – 2023-09-11 (×2): 1 mg via ORAL
  Filled 2023-09-10 (×2): qty 1

## 2023-09-10 MED ORDER — LACTATED RINGERS IV BOLUS
1000.0000 mL | Freq: Once | INTRAVENOUS | Status: AC
  Administered 2023-09-10: 1000 mL via INTRAVENOUS

## 2023-09-10 MED ORDER — PANTOPRAZOLE SODIUM 40 MG IV SOLR
40.0000 mg | Freq: Once | INTRAVENOUS | Status: AC
  Administered 2023-09-10: 40 mg via INTRAVENOUS
  Filled 2023-09-10: qty 10

## 2023-09-10 MED ORDER — INSULIN ASPART 100 UNIT/ML IJ SOLN
0.0000 [IU] | Freq: Three times a day (TID) | INTRAMUSCULAR | Status: DC
Start: 2023-09-10 — End: 2023-09-11
  Administered 2023-09-10: 2 [IU] via SUBCUTANEOUS
  Administered 2023-09-10: 3 [IU] via SUBCUTANEOUS
  Administered 2023-09-11: 8 [IU] via SUBCUTANEOUS
  Administered 2023-09-11: 3 [IU] via SUBCUTANEOUS

## 2023-09-10 MED ORDER — ADULT MULTIVITAMIN W/MINERALS CH
1.0000 | ORAL_TABLET | Freq: Every day | ORAL | Status: DC
  Administered 2023-09-10 – 2023-09-11 (×2): 1 via ORAL
  Filled 2023-09-10 (×2): qty 1

## 2023-09-10 MED ORDER — THIAMINE HCL 100 MG/ML IJ SOLN: 100.0000 mg | Freq: Every day | INTRAMUSCULAR | Status: DC

## 2023-09-10 MED ORDER — AMLODIPINE BESYLATE 5 MG PO TABS: 5.0000 mg | ORAL_TABLET | Freq: Every day | ORAL | Status: DC

## 2023-09-10 MED ORDER — HYDRALAZINE HCL 20 MG/ML IJ SOLN: 5.0000 mg | INTRAMUSCULAR | Status: DC | PRN

## 2023-09-10 MED ORDER — LORAZEPAM 1 MG PO TABS: 1.0000 mg | ORAL_TABLET | ORAL | Status: DC | PRN

## 2023-09-10 MED ORDER — LORAZEPAM 2 MG/ML IJ SOLN: 1.0000 mg | INTRAMUSCULAR | Status: DC | PRN

## 2023-09-10 MED ORDER — IOHEXOL 350 MG/ML SOLN
50.0000 mL | Freq: Once | INTRAVENOUS | Status: AC | PRN
  Administered 2023-09-10: 50 mL via INTRAVENOUS

## 2023-09-10 MED ORDER — THIAMINE MONONITRATE 100 MG PO TABS
100.0000 mg | ORAL_TABLET | Freq: Every day | ORAL | Status: DC
  Administered 2023-09-10 – 2023-09-11 (×2): 100 mg via ORAL
  Filled 2023-09-10 (×2): qty 1

## 2023-09-10 MED ORDER — PANTOPRAZOLE SODIUM 40 MG IV SOLR
40.0000 mg | Freq: Two times a day (BID) | INTRAVENOUS | Status: DC
  Administered 2023-09-10 – 2023-09-11 (×2): 40 mg via INTRAVENOUS
  Filled 2023-09-10 (×2): qty 10

## 2023-09-10 NOTE — H&P (Signed)
History and Physical    Patient: Louis Gibson UJW:[id number] DOB: [birth year] DOA: 09/10/2023 DOS: the patient was seen and examined on 09/10/2023 PCP: Fleet Contras, MD  Patient coming from: Home  Chief Complaint:  Chief Complaint  Patient presents with   Blood In Stools   HPI: Louis Gibson is a 58 y.o. male with medical history significant of diverticulosis, alcohol use disorder, hepatic steatosis, hypertension, type 2 diabetes, COPD, GERD presenting with rectal bleeding.  He reports that he was in his usual state of health yesterday until around 0100.  He went to the bathroom to pass a bowel movement and noticed that it was darker in color.  He had two subsequent bowel movements thereafter that were bloody.  Given that he had a history of rectal bleeding back in 2022, he decided come to the ED for further evaluation.  While in the ED, he reports having 5 more episodes of bloody bowel movements, 1 of which was witnessed by staff.  He denies any nausea, vomiting, fevers, chills, lightheadedness, dizziness, chest pain, palpitations, shortness of breath, abdominal pain, diarrhea, constipation, urinary changes.  He was able to eat half of a burger last night but has not eaten anything since that time.  He denies any NSAID use.  He is a heavy alcohol user, reports drinking about 1 to 2 pints of liquor a day.  His last drink was at 1800 yesterday.  In 2022, he was hospitalized for GI bleed and was seen by Dr. Elnoria Howard.  EGD at that time showed nonbleeding gastric ulcers and patient was advised to quit drinking alcohol.  Colonoscopy at that time revealed diverticulosis in the entire examined colon along with a couple of 1 to 2 mm polyps in the cecum that were removed.  ED course: Vital signs stable, moderately elevated blood pressure.  CBC without leukocytosis and with stable hemoglobin.  CMP with mild hyponatremia, hyperglycemia to 279, mildly elevated AST.  UA with glucosuria and mild  proteinuria, but no infection noted.  CT abdomen pelvis with contrast showing sparse diverticulosis in the transverse colon without acute diverticulitis (though study was not performed as a CTA) along with stable diffuse hepatic steatosis.  ED provider attempted to consult GI but has not yet heard back.  [facility] hospitalist asked to evaluate patient for admission.  Review of Systems: As mentioned in the history of present illness. All other systems reviewed and are negative. Past Medical History:  Diagnosis Date   Arthritis    Diabetes mellitus without complication (HCC)    Hypertension    Past Surgical History:  Procedure Laterality Date   BIOPSY  08/19/2021   Procedure: BIOPSY;  Surgeon: Jeani Hawking, MD;  Location: [facility] ENDOSCOPY;  Service: Endoscopy;;   COLONOSCOPY WITH PROPOFOL N/A 08/19/2021   Procedure: COLONOSCOPY WITH PROPOFOL;  Surgeon: Jeani Hawking, MD;  Location: [facility] ENDOSCOPY;  Service: Endoscopy;  Laterality: N/A;   ESOPHAGOGASTRODUODENOSCOPY (EGD) WITH PROPOFOL N/A 08/19/2021   Procedure: ESOPHAGOGASTRODUODENOSCOPY (EGD) WITH PROPOFOL;  Surgeon: Jeani Hawking, MD;  Location: [facility] ENDOSCOPY;  Service: Endoscopy;  Laterality: N/A;   POLYPECTOMY  08/19/2021   Procedure: POLYPECTOMY;  Surgeon: Jeani Hawking, MD;  Location: [facility] ENDOSCOPY;  Service: Endoscopy;;   Social History:  reports that he has been smoking cigars. He has never used smokeless tobacco. He reports current alcohol use. He reports that he does not use drugs.  Allergies  Allergen Reactions   Egg-Derived Products Nausea And Vomiting    Family History  Problem Relation Age of Onset  Hypertension Brother    CAD Maternal Uncle    Diabetes Mellitus II Maternal Uncle    Kidney disease Brother     Prior to Admission medications   Medication Sig Start Date End Date Taking? Authorizing Provider  amLODipine (NORVASC) 10 MG tablet Take 10 mg by mouth daily. 05/28/21  Yes [provider]  cholecalciferol  (VITAMIN D3) 25 MCG (1000 UNIT) tablet Take 1,000 Units by mouth daily.   Yes [provider]  CINNAMON PO Take 1,000 mg by mouth daily.   Yes [provider]  folic acid (FOLVITE) 800 MCG tablet Take 800 mcg by mouth daily.   Yes [provider]  gabapentin (NEURONTIN) 100 MG capsule Take 100 mg by mouth daily as needed (nerve pain). 04/25/21  Yes [provider]  glimepiride (AMARYL) 4 MG tablet Take 4 mg by mouth daily. 11/21/20  Yes [provider]  hydrochlorothiazide (HYDRODIURIL) 12.5 MG tablet Take 12.5 mg by mouth daily. 11/09/20  Yes [provider]  JANUVIA 100 MG tablet Take 100 mg by mouth daily. 11/21/20  Yes [provider]  pantoprazole (PROTONIX) 20 MG tablet Take 2 tablets (40 mg total) by mouth daily. Patient taking differently: Take 20 mg by mouth daily. 08/21/21 09/10/23 Yes Pieter Partridge, MD  thiamine (VITAMIN B-1) 100 MG tablet Take 100 mg by mouth daily.   Yes [provider]  tiZANidine (ZANAFLEX) 4 MG tablet Take 4 mg by mouth 2 (two) times daily as needed for muscle spasms. 09/03/23  Yes [provider]  TURMERIC EXTRA STRENGTH PO Take 1,000 mg by mouth daily.   Yes [provider]    Physical Exam: Vitals:   09/10/23 0844 09/10/23 0845 09/10/23 0923 09/10/23 1033  BP: (!) 134/99 (!) 146/90 (!) 147/95   Pulse: 98 92 92   Resp:   17   Temp:    98.3 F (36.8 C)  TempSrc:      SpO2:  100% 100%   Weight:      Height:       Physical Exam Constitutional:      Appearance: Normal appearance. He is obese. He is not ill-appearing.  HENT:     Head: Normocephalic and atraumatic.     Mouth/Throat:     Mouth: Mucous membranes are moist.     Pharynx: Oropharynx is clear. No oropharyngeal exudate.  Eyes:     General: No scleral icterus.    Extraocular Movements: Extraocular movements intact.     Pupils: Pupils are equal, round, and reactive to light.  Cardiovascular:     Rate  and Rhythm: Normal rate and regular rhythm.     Pulses: Normal pulses.     Heart sounds: Normal heart sounds. No murmur heard.    No friction rub. No gallop.  Pulmonary:     Effort: Pulmonary effort is normal.     Breath sounds: Normal breath sounds. No wheezing, rhonchi or rales.  Abdominal:     General: Bowel sounds are normal. There is no distension.     Palpations: Abdomen is soft.     Tenderness: There is no abdominal tenderness. There is no guarding or rebound.  Musculoskeletal:        General: No swelling. Normal range of motion.     Cervical back: Normal range of motion.  Skin:    General: Skin is warm and dry.  Neurological:     General: No focal deficit present.     Mental Status: He  is alert and oriented to person, place, and time.     Comments: No asterixis.  Psychiatric:        Mood and Affect: Mood normal.        Behavior: Behavior normal.     Data Reviewed:  There are no new results to review at this time.     Latest Ref Rng & Units 09/10/2023    5:13 AM 08/29/2023   12:46 AM 01/24/2023   12:00 AM  CBC  WBC 4.0 - 10.5 K/uL 8.8  9.2  7.4   Hemoglobin 13.0 - 17.0 g/dL 16.1  09.6  04.5   Hematocrit 39.0 - 52.0 % 47.6  52.0  48.6   Platelets 150 - 400 K/uL 237  219  213       Latest Ref Rng & Units 09/10/2023    5:13 AM 08/29/2023   12:46 AM 01/24/2023   12:00 AM  CMP  Glucose 70 - 99 mg/dL 409  811  914   BUN 6 - 20 mg/dL 8  7  8    Creatinine 0.61 - 1.24 mg/dL 7.82  9.56  2.13   Sodium 135 - 145 mmol/L 133  136  139   Potassium 3.5 - 5.1 mmol/L 3.9  3.5  4.0   Chloride 98 - 111 mmol/L 100  101  102   CO2 22 - 32 mmol/L 20  18  22    Calcium 8.9 - 10.3 mg/dL 9.8  9.5  08.6   Total Protein 6.5 - 8.1 g/dL 7.7  7.8  7.6   Total Bilirubin 0.0 - 1.2 mg/dL 1.0  1.0  0.9   Alkaline Phos 38 - 126 U/L 87  80    AST 15 - 41 U/L 42  28  23   ALT 0 - 44 U/L 32  25  20    CT ABDOMEN PELVIS W CONTRAST Result Date: 09/10/2023 CLINICAL DATA:  Blood in stool and  dizziness. EXAM: CT ABDOMEN AND PELVIS WITH CONTRAST TECHNIQUE: Multidetector CT imaging of the abdomen and pelvis was performed using the standard protocol following bolus administration of intravenous contrast. RADIATION DOSE REDUCTION: This exam was performed according to the departmental dose-optimization program which includes automated exposure control, adjustment of the mA and/or kV according to patient size and/or use of iterative reconstruction technique. CONTRAST:  50mL OMNIPAQUE IOHEXOL 350 MG/ML SOLN COMPARISON:  08/18/2021 FINDINGS: Lower chest: No acute abnormality. Hepatobiliary: Stable diffuse hepatic steatosis without overt cirrhotic morphology of the liver. No hepatic masses or biliary ductal dilatation. The gallbladder appears normal by CT. Pancreas: Unremarkable. No pancreatic ductal dilatation or surrounding inflammatory changes. Spleen: Normal in size without focal abnormality. Adrenals/Urinary Tract: Adrenal glands are unremarkable. Kidneys are normal, without renal calculi, focal lesion, or hydronephrosis. Bladder is unremarkable. Stomach/Bowel: Bowel shows no evidence of obstruction, ileus, inflammation or obvious lesion. The appendix is normal. No free intraperitoneal air. Sparse diverticulosis primarily the transverse colon. No evidence of acute diverticulitis. The study was not performed as a CTA for GI bleed. No overt obvious contrast extravasation into the GI tract. Vascular/Lymphatic: No significant vascular findings are present. No enlarged abdominal or pelvic lymph nodes. No varices identified. Reproductive: Prostate is unremarkable. Other: No abdominal wall hernia or abnormality. No abdominopelvic ascites. Musculoskeletal: No acute or significant osseous findings. IMPRESSION: 1. Stable diffuse hepatic steatosis without overt cirrhotic morphology of the liver. 2. Sparse diverticulosis primarily of the transverse colon without evidence of acute diverticulitis. 3. The study was not  performed as  a CTA for GI bleed. No overt obvious contrast extravasation into the GI tract. Electronically Signed   By: Irish Lack M.D.   On: 09/10/2023 08:26     Assessment and Plan: No notes have been filed under this hospital service. Service: Hospitalist  Lower GI Bleed Hx of diverticulosis Patient presenting with multiple episodes of hematochezia, concerning for lower GI bleed.  He has had 5 bloody bowel movements while in the ED, 1 of which was witnessed.  He had a prior episode of GI bleeding in 2022 and underwent EGD and colonoscopy at that time with Dr. Elnoria Howard.  EGD revealed nonbleeding gastric ulcers and was otherwise unremarkable.  Colonoscopy at that time revealed two 1-47mm polyps in the cecum which were removed with a cold snare along with diverticulosis in the entire examined colon.  CT abdomen pelvis with contrast today showing sparse diverticulosis in the transverse colon without diverticulitis (though study was not performed as a CTA for GI bleed).  He is hemodynamically stable. BUN normal on CMP.  Presentation is concerning for diverticular bleed. -Consulted GI, Dr. Loreta Ave, who will come to evaluate patient, appreciate assistance -IV Protonix twice daily -Trend hemoglobin curve (hgb 16.7 @0515  >> hgb 14.6 @1415 ) -H&H now and again tonight to check trend, CBC tomorrow morning -s/p 1L IVF for rehydration -Transfuse for hemoglobin less than 7 and/or hemodynamic instability -NPO until GI evaluation -Place in observation -telemetry -SCDs for DVT ppx  Alcohol use disorder Hepatic steatosis Patient reports drinking 1-2 pints/day for a long time now.  He was advised to stop drinking after his GI bleed in 2022 though he has not stopped yet.  CT imaging showing stable diffuse hepatic steatosis, likely alcoholic, without overt cirrhotic morphology.  He does report that his last drink was at 1800 last night.  Given significant amount of alcohol use, will place on CIWA protocol with  Ativan in case he experiences withdrawals.  No history of DTs per patient. -CIWA with ativan -thiamine, MVA, folate -TOC consult for substance use resources  HTN -BP elevated in the ED, ranging from 140s-160s/90s -takes norvasc 10mg  daily and hydrochlorothiazide 12.5mg  daily at home -will hold home antihypertensives given multiple episodes of hematochezia today and risk for hypotension -will add prn hydralazine 5mg  for SBP >180  T2DM with hyperglycemia Last A1c on file of 6.9% about 2 years ago. He reports currently taking Januvia 100mg  daily and glimepiride 4mg  daily at home. Random BG of 279 on CMP. Will start SSI while here. -A1c 7.7% today -moderate SSI  -trend CBGs, goal 140-180 -holding home januvia and glimepiride    Advance Care Planning:   Code Status: Full Code   Consults: GI  Family Communication: updated daughter at bedside  Severity of Illness: The appropriate patient status for this patient is OBSERVATION. Observation status is judged to be reasonable and necessary in order to provide the required intensity of service to ensure the patient's safety. The patient's presenting symptoms, physical exam findings, and initial radiographic and laboratory data in the context of their medical condition is felt to place them at decreased risk for further clinical deterioration. Furthermore, it is anticipated that the patient will be medically stable for discharge from the hospital within 2 midnights of admission.   Author: Briscoe Burns, MD 09/10/2023 11:52 AM  For on call review [email or web address].

## 2023-09-10 NOTE — ED Provider Notes (Signed)
Accepted handoff at shift change from [facility]. Please see prior provider note for more detail.   Briefly: Patient is 58 y.o.   DDX: concern for multiple bloody stools, history of alcohol abuse, history of diverticular bleed.  He is pending CT abdomen and probable hospital admission with GI consult at time of handoff.  Plan: I dependently interpreted CT abdomen pelvis with contrast which shows some diverticulosis, no diverticulitis, no other acute intra-abdominal abnormality to explain his symptoms.  Spoke with the hospitalist who agrees to admission for acute GI bleed, will plan on GI consult.  I placed a consult to GI but have not heard from them at time of hospitalist admission.  We repaged them and still have not heard back.    [place] 09/10/23 1137    Laurence Spates, MD 09/13/23 1446

## 2023-09-10 NOTE — ED Triage Notes (Signed)
Pt states he had blood in stool this morning when he went to bathroom. Pt describes as bright and dark red. Pt denies pain, nausea or vomiting. Pt denies dizziness, weakness, or constipation.

## 2023-09-10 NOTE — ED Provider Notes (Addendum)
EMERGENCY DEPARTMENT AT [facility] Provider Note   CSN: [id number] Arrival date & time: 09/10/23  0446     History Chief Complaint  Patient presents with   Blood In Stools    Louis Gibson is a 58 y.o. male with h/o lower GI bleed, diabetes, hypertension, arthritis presents to the ER for evaluation of lower GI bleed since this AM.  The patient reports that he woke up at 0300 this morning with bloody bowel movements.  Denies any melena.  He denies any pain with defecation.  There is no blood on the toilet paper.  He reports he is had symptoms similar to this in 2022 where he had a colonoscopy however he does not remember the results of this.  Denies any nausea, vomiting, urinary symptoms, or any fever.  He reports he either had a wind use or [employer] burger last night, he cannot remember.  He reports has been having some right sided cramping in the abdomen.  Patient is a daily alcohol drinker with 1 to 2 pints of liquor.  Last drink was yesterday around 1800.  HPI     Home Medications Prior to Admission medications   Medication Sig Start Date End Date Taking? Authorizing Provider  amLODipine (NORVASC) 10 MG tablet Take 10 mg by mouth daily. 05/28/21   [provider]  cetirizine (ZYRTEC ALLERGY) 10 MG tablet Take 1 tablet (10 mg total) by mouth at bedtime. 10/24/22 04/22/23  Theadora Rama Scales, PA-C  cholecalciferol (VITAMIN D3) 25 MCG (1000 UNIT) tablet Take 1,000 Units by mouth daily.    [provider]  fluticasone (FLONASE) 50 MCG/ACT nasal spray Place 1 spray into both nostrils daily. Begin by using 2 sprays in each nare daily for 3 to 5 days, then decrease to 1 spray in each nare daily. 10/24/22   Theadora Rama Scales, PA-C  folic acid (FOLVITE) 800 MCG tablet Take 800 mcg by mouth daily.    [provider]  gabapentin (NEURONTIN) 100 MG capsule Take 100 mg by mouth daily. 04/25/21   [provider]  glimepiride (AMARYL) 4 MG  tablet Take 4 mg by mouth daily. 11/21/20   [provider]  hydrochlorothiazide (HYDRODIURIL) 12.5 MG tablet Take 12.5 mg by mouth daily. 11/09/20   [provider]  JANUVIA 100 MG tablet Take 100 mg by mouth daily. 11/21/20   [provider]  metFORMIN (GLUCOPHAGE) 500 MG tablet Take 1 tablet (500 mg total) by mouth 2 (two) times daily for 21 days. 10/15/19 11/05/19  Caccavale, Sophia, PA-C  Misc Natural Products (GLUCOSAMINE CHONDROITIN COMPLX) TABS Take 1 tablet by mouth 2 (two) times daily.    [provider]  pantoprazole (PROTONIX) 20 MG tablet Take 2 tablets (40 mg total) by mouth daily. 08/21/21 03/07/23  Pieter Partridge, MD  promethazine-dextromethorphan (PROMETHAZINE-DM) 6.25-15 MG/5ML syrup Take 5 mLs by mouth 3 (three) times daily as needed for cough. 03/07/23   Bing Neighbors, NP  thiamine (VITAMIN B-1) 100 MG tablet Take 100 mg by mouth daily.    [provider]  Vitamin D, Ergocalciferol, (DRISDOL) 1.25 MG (50000 UNIT) CAPS capsule Take by mouth. 11/22/20   [provider]      Allergies    Egg-derived products    Review of Systems   Review of Systems  Constitutional:  Negative for chills and fever.  Respiratory:  Negative for shortness of breath.   Cardiovascular:  Negative for chest pain.  Gastrointestinal:  Positive for  abdominal pain and blood in stool. Negative for nausea and vomiting.    Physical Exam Updated Vital Signs BP (!) 152/102 (BP Location: Right Arm) Comment: States his last dose of blood pressure medication was friday  Pulse 99   Temp 98.4 F (36.9 C) (Oral)   Resp 18   Ht 6' (1.829 m)   Wt 127 kg   SpO2 100%   BMI 37.97 kg/m  Physical Exam Vitals and nursing note reviewed. Exam conducted with a chaperone present Molli Hazard, NT).  Constitutional:      General: He is not in acute distress.    Appearance: He is not ill-appearing or toxic-appearing.  HENT:     Mouth/Throat:     Mouth: Mucous  membranes are moist.  Cardiovascular:     Rate and Rhythm: Normal rate.  Pulmonary:     Effort: Pulmonary effort is normal. No respiratory distress.  Abdominal:     Palpations: Abdomen is soft.     Tenderness: There is abdominal tenderness. There is no guarding or rebound.     Comments: Right very lateral tenderness to palpation.  No CVA tenderness.  Soft.  No guarding or rebound.  Genitourinary:    Rectum: No tenderness.     Comments: No fissures seen. Has some inflammation circumferentially past the anal sphincter. Has small amount of bright red blood remnants and on DRE has more darker red blood, but small amount.   Skin:    General: Skin is warm and dry.  Neurological:     Mental Status: He is alert.     ED Results / Procedures / Treatments   Labs (all labs ordered are listed, but only abnormal results are displayed) Labs Reviewed  COMPREHENSIVE METABOLIC PANEL - Abnormal; Notable for the following components:      Result Value   Sodium 133 (*)    CO2 20 (*)    Glucose, Bld 279 (*)    AST 42 (*)    All other components within normal limits  CBC - Abnormal; Notable for the following components:   RDW 11.4 (*)    All other components within normal limits  URINALYSIS, ROUTINE W REFLEX MICROSCOPIC  POC OCCULT BLOOD, ED  TYPE AND SCREEN    EKG None  Radiology No results found.  Procedures Procedures   Medications Ordered in ED Medications - No data to display  ED Course/ Medical Decision Making/ A&P Clinical Course as of 09/10/23 0722  Mon Sep 10, 2023  1914 Hx of lower GI bleed, some tachycardia, may be small amount of withdrawal. Drinks pint or two of liquor nightly. 3am having bloody stools, no BRBPR, but reports multiple events even since being here. Right lateral abdominal pain with movement. Ate some fast food last night. No dysuria. Has been admitted previously for similar.  [CP]  0721 Some external red, rectum looks generally inflamed. Clotted blood noted  on rectum. Blood and stool x 4-5x since being here. [CP]    Clinical Course User Index [CP] Olene Floss, PA-C   Medical Decision Making Amount and/or Complexity of Data Reviewed Labs: ordered. Radiology: ordered.  Risk OTC drugs. Prescription drug management.  58 y.o. male presents to the ER for evaluation of lower GI bleed. Differential diagnosis includes but is not limited to Diverticulitis, IBD, colitis, mesenteric ischemia, colorectal cancer / polyps, hemorrhoids, rectal foreign body, anal fissure. Vital signs mildly elevated blood pressure otherwise unremarkable. Physical exam as noted above.   On previous chart evaluation, the patient was  admitted here in December 2022 for similar presentation.  He had not endoscopy and colonoscopy that showed 3 gastric ulcers as well as some polyps.  He was discharged home with outpatient follow-up and Protonix.  I independently reviewed and interpreted the patient's labs.  CBC does not show any leukocytosis or anemia.  CMP shows mildly decreased sodium 133, bicarb of 20, glucose of 279.  Mildly elevated AST at 42, otherwise no other electrolyte or LFT abnormality.  Urinalysis pending.  CT imaging ordered.   I have ordered a CIWA for the patient.  He does have some tachycardia however does not appear in any acute distress.  Concern for withdrawal given the amount of alcohol he consumes.  He does not appear tremulous.  White count unremarkable, and patient is afebrile.  Less likely sepsis.  6:35 AM Care of Eithan Gingery  transferred to [employer] [facility] at the end of my shift as the patient will require reassessment once labs/imaging have resulted. Patient presentation, ED course, and plan of care discussed with review of all pertinent labs and imaging. Please see his/her note for further details regarding further ED course and disposition. Plan at time of handoff is follow up on CT imaging. This may be altered or completely changed at  the discretion of the oncoming team pending results of further workup.  Portions of this report may have been transcribed using voice recognition software. Every effort was made to ensure accuracy; however, inadvertent computerized transcription errors may be present.   Final Clinical Impression(s) / ED Diagnoses Final diagnoses:  None    Rx / DC Orders ED Discharge Orders     None         Achille Rich, PA-C 09/10/23 0713    Achille Rich, PA-C 09/10/23 4098    Marily Memos, MD 09/10/23 [phone]

## 2023-09-10 NOTE — Consult Note (Signed)
Reason for Consult: Rectal bleeding. Referring Physician: [facility] hospitalist.  Louis Gibson is an 58 y.o. male.  HPI: The patient is a 58 year old black male with multiple medical problems listed below including AODM, COPD, GERD, alcohol use disorder and fatty liver who presents with a history of rectal bleeding since 1 AM this morning.  He has had multiple bloody stool similar to the rectal bleeding he had in 2002 which was thought to be secondary to diverticulosis.  He had a EGD and colonoscopy done on 08/20/2019 was noted to have diverticular disease throughout the colon couple of small tube adenomas removed from the cecum.  He also was noted to have an antral ulcer which was negative for H. pylori and pathology.  Patient suffers from constipation we can go couple of days without having a bowel movement.  He denies having any nausea vomiting dysphagia odynophagia. There is no known family history of colon cancer.  In the emergency room a CT was performed that showed some diverticulosis in the transverse colon along with diffuse hepatic steatosis. A CTA was noted for reasons not clear to me. Patient denies the use of any nonsteroidals does not take any blood thinners on a regular basis.  Past Medical History:  Diagnosis Date   Arthritis    Diabetes mellitus without complication (HCC)    Hypertension    Past Surgical History:  Procedure Laterality Date   BIOPSY  08/19/2021   Procedure: BIOPSY;  Surgeon: Jeani Hawking, MD;  Location: [facility] ENDOSCOPY;  Service: Endoscopy;;   COLONOSCOPY WITH PROPOFOL N/A 08/19/2021   Procedure: COLONOSCOPY WITH PROPOFOL;  Surgeon: Jeani Hawking, MD;  Location: [facility] ENDOSCOPY;  Service: Endoscopy;  Laterality: N/A;   ESOPHAGOGASTRODUODENOSCOPY (EGD) WITH PROPOFOL N/A 08/19/2021   Procedure: ESOPHAGOGASTRODUODENOSCOPY (EGD) WITH PROPOFOL;  Surgeon: Jeani Hawking, MD;  Location: [facility] ENDOSCOPY;  Service: Endoscopy;  Laterality: N/A;   POLYPECTOMY  08/19/2021    Procedure: POLYPECTOMY;  Surgeon: Jeani Hawking, MD;  Location: [facility] ENDOSCOPY;  Service: Endoscopy;;   Family History  Problem Relation Age of Onset   Hypertension Brother    CAD Maternal Uncle    Diabetes Mellitus II Maternal Uncle    Kidney disease Brother    Social History:  reports that he has been smoking cigars. He has never used smokeless tobacco. He reports current alcohol use. He reports that he does not use drugs.  Allergies:  Allergies  Allergen Reactions   Egg-Derived Products Nausea And Vomiting    Medications: I have reviewed the patient's current medications. Prior to Admission: (Not in a hospital admission)  Scheduled:  folic acid  1 mg Oral Daily   insulin aspart  0-15 Units Subcutaneous TID WC   multivitamin with minerals  1 tablet Oral Daily   pantoprazole (PROTONIX) IV  40 mg Intravenous Q12H   thiamine  100 mg Oral Daily   Or   thiamine  100 mg Intravenous Daily   Continuous: [id number], LORazepam **OR** LORazepam Anti-infectives (From admission, onward)    None       Results for orders placed or performed during the hospital encounter of 09/10/23 (from the past 48 hours)  Type and screen [facility]     Status: None   Collection Time: 09/10/23  5:10 AM  Result Value Ref Range   ABO/RH(D) B POS    Antibody Screen NEG    Sample Expiration      09/13/2023,2359 Performed at [facility], [address], [place], [place] [address]  Comprehensive metabolic panel     Status: Abnormal   Collection Time: 09/10/23  5:13 AM  Result Value Ref Range   Sodium 133 (L) 135 - 145 mmol/L   Potassium 3.9 3.5 - 5.1 mmol/L   Chloride 100 98 - 111 mmol/L   CO2 20 (L) 22 - 32 mmol/L   Glucose, Bld 279 (H) 70 - 99 mg/dL    Comment: Glucose reference range applies only to samples taken after fasting for at least 8 hours.   BUN 8 6 - 20 mg/dL   Creatinine, Ser 4.09 0.61 - 1.24 mg/dL   Calcium 9.8 8.9 - 81.1 mg/dL   Total Protein 7.7  6.5 - 8.1 g/dL   Albumin 4.1 3.5 - 5.0 g/dL   AST 42 (H) 15 - 41 U/L   ALT 32 0 - 44 U/L   Alkaline Phosphatase 87 38 - 126 U/L   Total Bilirubin 1.0 0.0 - 1.2 mg/dL   GFR, Estimated >91 >47 mL/min    Comment: (NOTE) Calculated using the CKD-EPI Creatinine Equation (2021)    Anion gap 13 5 - 15    Comment: Performed at [facility], [address], [place], [place] [address]  CBC     Status: Abnormal   Collection Time: 09/10/23  5:13 AM  Result Value Ref Range   WBC 8.8 4.0 - 10.5 K/uL   RBC 5.27 4.22 - 5.81 MIL/uL   Hemoglobin 16.7 13.0 - 17.0 g/dL   HCT 21.3 08.6 - 57.8 %   MCV 90.3 80.0 - 100.0 fL   MCH 31.7 26.0 - 34.0 pg   MCHC 35.1 30.0 - 36.0 g/dL   RDW 46.9 (L) 62.9 - 52.8 %   Platelets 237 150 - 400 K/uL   nRBC 0.0 0.0 - 0.2 %    Comment: Performed at [facility], [address], [place], [place] [address]  Urinalysis, Routine w reflex microscopic -Urine, Clean Catch     Status: Abnormal   Collection Time: 09/10/23  6:21 AM  Result Value Ref Range   Color, Urine YELLOW YELLOW   APPearance CLEAR CLEAR   Specific Gravity, Urine 1.020 1.005 - 1.030   pH 5.0 5.0 - 8.0   Glucose, UA >=500 (A) NEGATIVE mg/dL   Hgb urine dipstick NEGATIVE NEGATIVE   Bilirubin Urine NEGATIVE NEGATIVE   Ketones, ur NEGATIVE NEGATIVE mg/dL   Protein, ur 30 (A) NEGATIVE mg/dL   Nitrite NEGATIVE NEGATIVE   Leukocytes,Ua NEGATIVE NEGATIVE   RBC / HPF 0-5 0 - 5 RBC/hpf   WBC, UA 0-5 0 - 5 WBC/hpf   Bacteria, UA NONE SEEN NONE SEEN   Squamous Epithelial / HPF 0-5 0 - 5 /HPF   Mucus PRESENT    Hyaline Casts, UA PRESENT     Comment: Performed at [facility], [address], [place], [place] [address]  CBG monitoring, ED     Status: Abnormal   Collection Time: 09/10/23 11:18 AM  Result Value Ref Range   Glucose-Capillary 190 (H) 70 - 99 mg/dL    Comment: Glucose reference range applies only to samples taken after fasting for at least 8 hours.  Hemoglobin A1c     Status:  Abnormal   Collection Time: 09/10/23  2:12 PM  Result Value Ref Range   Hgb A1c MFr Bld 7.7 (H) 4.8 - 5.6 %    Comment: (NOTE) Pre diabetes:          5.7%-6.4%  Diabetes:              >  6.4%  Glycemic control for   <7.0% adults with diabetes    Mean Plasma Glucose 174.29 mg/dL    Comment: Performed at [facility], [address], [place], [place] [address]  Hemoglobin and hematocrit, blood     Status: None   Collection Time: 09/10/23  2:12 PM  Result Value Ref Range   Hemoglobin 14.6 13.0 - 17.0 g/dL   HCT 91.4 78.2 - 95.6 %    Comment: Performed at [facility], [address], [place], [place] [address]   CT ABDOMEN PELVIS W CONTRAST Result Date: 09/10/2023 CLINICAL DATA:  Blood in stool and dizziness. EXAM: CT ABDOMEN AND PELVIS WITH CONTRAST TECHNIQUE: Multidetector CT imaging of the abdomen and pelvis was performed using the standard protocol following bolus administration of intravenous contrast. RADIATION DOSE REDUCTION: This exam was performed according to the departmental dose-optimization program which includes automated exposure control, adjustment of the mA and/or kV according to patient size and/or use of iterative reconstruction technique. CONTRAST:  50mL OMNIPAQUE IOHEXOL 350 MG/ML SOLN COMPARISON:  08/18/2021 FINDINGS: Lower chest: No acute abnormality. Hepatobiliary: Stable diffuse hepatic steatosis without overt cirrhotic morphology of the liver. No hepatic masses or biliary ductal dilatation. The gallbladder appears normal by CT. Pancreas: Unremarkable. No pancreatic ductal dilatation or surrounding inflammatory changes. Spleen: Normal in size without focal abnormality. Adrenals/Urinary Tract: Adrenal glands are unremarkable. Kidneys are normal, without renal calculi, focal lesion, or hydronephrosis. Bladder is unremarkable. Stomach/Bowel: Bowel shows no evidence of obstruction, ileus, inflammation or obvious lesion. The appendix is normal. No free intraperitoneal  air. Sparse diverticulosis primarily the transverse colon. No evidence of acute diverticulitis. The study was not performed as a CTA for GI bleed. No overt obvious contrast extravasation into the GI tract. Vascular/Lymphatic: No significant vascular findings are present. No enlarged abdominal or pelvic lymph nodes. No varices identified. Reproductive: Prostate is unremarkable. Other: No abdominal wall hernia or abnormality. No abdominopelvic ascites. Musculoskeletal: No acute or significant osseous findings. IMPRESSION: 1. Stable diffuse hepatic steatosis without overt cirrhotic morphology of the liver. 2. Sparse diverticulosis primarily of the transverse colon without evidence of acute diverticulitis. 3. The study was not performed as a CTA for GI bleed. No overt obvious contrast extravasation into the GI tract. Electronically Signed   By: Irish Lack M.D.   On: 09/10/2023 08:26    Review of Systems  Constitutional:  Negative for activity change, appetite change, chills, diaphoresis, fatigue, fever and unexpected weight change.  HENT: Negative.    Eyes: Negative.   Respiratory: Negative.    Cardiovascular: Negative.   Gastrointestinal:  Positive for anal bleeding, blood in stool and constipation. Negative for abdominal distention, abdominal pain, diarrhea and nausea.  Endocrine: Negative.   Genitourinary: Negative.   Musculoskeletal:  Positive for arthralgias.  Allergic/Immunologic: Negative.   Neurological: Negative.   Hematological: Negative.   Psychiatric/Behavioral: Negative.     Blood pressure (!) 128/90, pulse 82, temperature 98.3 F (36.8 C), resp. rate 17, height 6' (1.829 m), weight 127 kg, SpO2 100%. Physical Exam Constitutional:      General: He is not in acute distress.    Appearance: He is obese. He is not toxic-appearing.  HENT:     Head: Normocephalic and atraumatic.     Mouth/Throat:     Mouth: Mucous membranes are dry.  Eyes:     Extraocular Movements: Extraocular  movements intact.     Pupils: Pupils are equal, round, and reactive to light.  Cardiovascular:  Rate and Rhythm: Normal rate.  Pulmonary:     Effort: Pulmonary effort is normal.     Breath sounds: Normal breath sounds.  Abdominal:     General: Bowel sounds are normal.  Musculoskeletal:        General: Normal range of motion.     Cervical back: Normal range of motion and neck supple.  Skin:    General: Skin is warm and dry.  Neurological:     General: No focal deficit present.     Mental Status: He is alert and oriented to person, place, and time.  Psychiatric:        Mood and Affect: Mood normal.        Behavior: Behavior normal.        Thought Content: Thought content normal.        Judgment: Judgment normal.   Assessment/Plan: 1) Rectal bleeding presumed due to recurrent diverticular bleed.  Plans are to observe the patient and keep him on clear liquids for now. 2) Constipation-the importance of a high-fiber diet with liberal fluid intake and use of stool softeners as needed has been discussed with them in great detail. 3) Alcohol use disorder with hepatic steatosis on CIWA protocol 4) Hypertension 5) Morbid obesity.    Charna Elizabeth 09/10/2023, 3:26 PM

## 2023-09-11 DIAGNOSIS — K922 Gastrointestinal hemorrhage, unspecified: Secondary | ICD-10-CM | POA: Diagnosis not present

## 2023-09-11 LAB — COMPREHENSIVE METABOLIC PANEL
ALT: 21 U/L (ref 0–44)
AST: 23 U/L (ref 15–41)
Albumin: 3.4 g/dL — ABNORMAL LOW (ref 3.5–5.0)
Alkaline Phosphatase: 49 U/L (ref 38–126)
Anion gap: 9 (ref 5–15)
BUN: 6 mg/dL (ref 6–20)
CO2: 27 mmol/L (ref 22–32)
Calcium: 9.3 mg/dL (ref 8.9–10.3)
Chloride: 100 mmol/L (ref 98–111)
Creatinine, Ser: 0.81 mg/dL (ref 0.61–1.24)
GFR, Estimated: >60 mL/min (ref >60)
Glucose, Bld: 149 mg/dL — ABNORMAL HIGH (ref 70–99)
Potassium: 3.8 mmol/L (ref 3.5–5.1)
Sodium: 136 mmol/L (ref 135–145)
Total Bilirubin: 1.8 mg/dL — ABNORMAL HIGH (ref 0.0–1.2)
Total Protein: 6.4 g/dL — ABNORMAL LOW (ref 6.5–8.1)

## 2023-09-11 LAB — CBC
HCT: 40.6 % (ref 39.0–52.0)
Hemoglobin: 14.5 g/dL (ref 13.0–17.0)
MCH: 32 pg (ref 26.0–34.0)
MCHC: 35.7 g/dL (ref 30.0–36.0)
MCV: 89.6 fL (ref 80.0–100.0)
Platelets: 181 10*3/uL (ref 150–400)
RBC: 4.53 MIL/uL (ref 4.22–5.81)
RDW: 11.6 % (ref 11.5–15.5)
WBC: 6.7 10*3/uL (ref 4.0–10.5)
nRBC: 0 % (ref 0.0–0.2)

## 2023-09-11 LAB — GLUCOSE, CAPILLARY
Glucose-Capillary: 179 mg/dL — ABNORMAL HIGH (ref 70–99)
Glucose-Capillary: 246 mg/dL — ABNORMAL HIGH (ref 70–99)

## 2023-09-11 LAB — APTT: aPTT: 29 s (ref 24–36)

## 2023-09-11 LAB — PROTIME-INR
INR: 1.1 (ref 0.8–1.2)
Prothrombin Time: 14 s (ref 11.4–15.2)

## 2023-09-11 NOTE — Hospital Course (Addendum)
58 year old black male with multiple medical problems listed below including AODM, COPD, GERD, alcohol use disorder and fatty liver who presents with a history of rectal bleeding since 1 AM to ED,had multiple bloody stool similar to the rectal bleeding he had in 2002 which was thought to be secondary to diverticulosis. He had a EGD and colonoscopy done on 08/20/2019 was noted to have diverticular disease throughout the colon couple of small tube adenomas removed from the cecum.  He also was noted to have an antral ulcer which was negative for H. pylori and pathology.  Patient suffers from constipation we can go couple of days without having a bowel movement. In the ED BP on higher side otherwise stable.  Labs mild hyponatremia hyperglycemia 279 stable renal function LFTs and CBC and UA CT  ABD pelvis w/ contrast> showed some diverticulosis in the transverse colon along with diffuse hepatic steatosis. Patient denies the use of any nonsteroidals does not take any blood thinners on a regular basis.  Patient admitted for lower GI bleeding and GI consulted.  Patient has had no bowel movement since admission's hemoglobin has stabilized, tolerating diet.  Discussed with GI okay for advancing diet and can discharge home if tolerating with outpatient follow-up with Dr. Elnoria Howard his primary GI

## 2023-09-11 NOTE — Progress Notes (Signed)
Discharge instructions given. Pat verbalized understanding and all questions were answered.   Patient tolerated breakfast well. Refused lunch but had a snack and tolerated it well.

## 2023-09-11 NOTE — TOC Progression Note (Signed)
Transition of Care [facility]) - Progression Note    Patient Details  Name: Louis Gibson MRN: [id number] Date of Birth: [birth year]  Transition of [facility] CM/SW Contact  Ziaire Bieser [place], [place] Phone Number: 09/11/2023, 1:07 PM  Clinical Narrative:     ETOH treatment resources added to AVS.        Expected Discharge Plan and Services         Expected Discharge Date: 09/11/23                                     Social Determinants of Health (SDOH) Interventions SDOH Screenings   Food Insecurity: No Food Insecurity (09/10/2023)  Housing: Low Risk  (09/10/2023)  Transportation Needs: No Transportation Needs (09/10/2023)  Utilities: Not At Risk (09/10/2023)  Social Connections: Unknown (12/30/2021)   [facility] from [facility]  Tobacco Use: High Risk (09/10/2023)    Readmission Risk Interventions     No data to display

## 2023-09-11 NOTE — Discharge Summary (Signed)
Physician Discharge Summary  Louis Gibson ONG:[id number] DOB: 04/21/1966 DOA: 09/10/2023  PCP: Fleet Contras, MD  Admit date: 09/10/2023 Discharge date: 09/11/2023 Recommendations for Outpatient Follow-up:  Follow up with PCP in 1 weeks-call for appointment Please obtain BMP/CBC in one week  Discharge Dispo: Home Discharge Condition: Stable Code Status:   Code Status: Full Code Diet recommendation:  Diet Order             DIET DYS 3 Room service appropriate? Yes; Fluid consistency: Thin  Diet effective now                    Brief/Interim Summary: 58 year old black male with multiple medical problems listed below including AODM, COPD, GERD, alcohol use disorder and fatty liver who presents with a history of rectal bleeding since 1 AM to ED,had multiple bloody stool similar to the rectal bleeding he had in 2002 which was thought to be secondary to diverticulosis. He had a EGD and colonoscopy done on 08/20/2019 was noted to have diverticular disease throughout the colon couple of small tube adenomas removed from the cecum.  He also was noted to have an antral ulcer which was negative for H. pylori and pathology.  Patient suffers from constipation we can go couple of days without having a bowel movement. In the ED BP on higher side otherwise stable.  Labs mild hyponatremia hyperglycemia 279 stable renal function LFTs and CBC and UA CT  ABD pelvis w/ contrast> showed some diverticulosis in the transverse colon along with diffuse hepatic steatosis. Patient denies the use of any nonsteroidals does not take any blood thinners on a regular basis.  Patient admitted for lower GI bleeding and GI consulted.  Patient has had no bowel movement since admission's hemoglobin has stabilized, tolerating diet.  Discussed with GI okay for advancing diet and can discharge home if tolerating with outpatient follow-up with Dr. Elnoria Howard his primary GI    Discharge Diagnoses:  Principal Problem:   Acute GI  bleeding Rectal bleeding presumed to be recurrent diverticular bleed Constipation: GI has been consulted at this time plan is to monitor and manage conservatively, clear liquid diet, monitor hemoglobin, need high-fiber diet with liberal fluid intake to manage constipation along with a stool softener.  No more recurrence of bleeding.  Tolerating diet, advancing diet and discharge home today  Alcohol abuse Hepatic steatosis: As for withdrawal continue CIWA Ativan, thiamine MVI folate and THC consult.  Hypertension: Fairly stable continue to hold home HCTZ and Norvasc. Resume on dc  T2DM with hyperglycemia: Recent A1c 7.7 now.resume home meds upon discharge  Consults: GI Subjective: Tolerated DIET. oriented .  No more bleeding.  Discharge Exam: Vitals:   09/11/23 0134 09/11/23 0729  BP: (!) 141/94 (!) 138/92  Pulse: 72 78  Resp: 20   Temp: 98.5 F (36.9 C) 98.1 F (36.7 C)  SpO2: 98% 100%   General: Pt is alert, awake, not in acute distress Cardiovascular: RRR, S1/S2 +, no rubs, no gallops Respiratory: CTA bilaterally, no wheezing, no rhonchi Abdominal: Soft, NT, ND, bowel sounds + Extremities: no edema, no cyanosis  Discharge Instructions  Discharge Instructions     Discharge instructions   Complete by: As directed    Follow-up with Dr. Elnoria Howard from GI if recurrent bleeding  Please call call MD or return to ER for similar or worsening recurring problem that brought you to hospital or if any fever,nausea/vomiting,abdominal pain, uncontrolled pain, chest pain,  shortness of breath or any other alarming symptoms.  Please follow-up your doctor as instructed in a week time and call the office for appointment.  Please avoid alcohol, smoking, or any other illicit substance and maintain healthy habits including taking your regular medications as prescribed.  You were cared for by a hospitalist during your hospital stay. If you have any questions about your discharge medications  or the care you received while you were in the hospital after you are discharged, you can call the unit and ask to speak with the hospitalist on call if the hospitalist that took care of you is not available.  Once you are discharged, your primary care physician will handle any further medical issues. Please note that NO REFILLS for any discharge medications will be authorized once you are discharged, as it is imperative that you return to your primary care physician (or establish a relationship with a primary care physician if you do not have one) for your aftercare needs so that they can reassess your need for medications and monitor your lab values   Increase activity slowly   Complete by: As directed       Allergies as of 09/11/2023       Reactions   Egg-derived Products Nausea And Vomiting        Medication List     TAKE these medications    amLODipine 10 MG tablet Commonly known as: NORVASC Take 10 mg by mouth daily.   cholecalciferol 25 MCG (1000 UNIT) tablet Commonly known as: VITAMIN D3 Take 1,000 Units by mouth daily.   CINNAMON PO Take 1,000 mg by mouth daily.   folic acid 800 MCG tablet Commonly known as: FOLVITE Take 800 mcg by mouth daily.   gabapentin 100 MG capsule Commonly known as: NEURONTIN Take 100 mg by mouth daily as needed (nerve pain).   glimepiride 4 MG tablet Commonly known as: AMARYL Take 4 mg by mouth daily.   hydrochlorothiazide 12.5 MG tablet Commonly known as: HYDRODIURIL Take 12.5 mg by mouth daily.   Januvia 100 MG tablet Generic drug: sitaGLIPtin Take 100 mg by mouth daily.   pantoprazole 20 MG tablet Commonly known as: Protonix Take 2 tablets (40 mg total) by mouth daily. What changed: how much to take   thiamine 100 MG tablet Commonly known as: Vitamin B-1 Take 100 mg by mouth daily.   tiZANidine 4 MG tablet Commonly known as: ZANAFLEX Take 4 mg by mouth 2 (two) times daily as needed for muscle spasms.   TURMERIC  EXTRA STRENGTH PO Take 1,000 mg by mouth daily.        Follow-up Information     Fleet Contras, MD Follow up in 1 week(s).   Specialty: Internal Medicine Contact information: [address] [place] [address] [phone]                Allergies  Allergen Reactions   Egg-Derived Products Nausea And Vomiting    The results of significant diagnostics from this hospitalization (including imaging, microbiology, ancillary and laboratory) are listed below for reference.    Microbiology: No results found for this or any previous visit (from the past 240 hours).  Procedures/Studies: CT ABDOMEN PELVIS W CONTRAST Result Date: 09/10/2023 CLINICAL DATA:  Blood in stool and dizziness. EXAM: CT ABDOMEN AND PELVIS WITH CONTRAST TECHNIQUE: Multidetector CT imaging of the abdomen and pelvis was performed using the standard protocol following bolus administration of intravenous contrast. RADIATION DOSE REDUCTION: This exam was performed according to the departmental dose-optimization program which includes automated exposure control,  adjustment of the mA and/or kV according to patient size and/or use of iterative reconstruction technique. CONTRAST:  50mL OMNIPAQUE IOHEXOL 350 MG/ML SOLN COMPARISON:  08/18/2021 FINDINGS: Lower chest: No acute abnormality. Hepatobiliary: Stable diffuse hepatic steatosis without overt cirrhotic morphology of the liver. No hepatic masses or biliary ductal dilatation. The gallbladder appears normal by CT. Pancreas: Unremarkable. No pancreatic ductal dilatation or surrounding inflammatory changes. Spleen: Normal in size without focal abnormality. Adrenals/Urinary Tract: Adrenal glands are unremarkable. Kidneys are normal, without renal calculi, focal lesion, or hydronephrosis. Bladder is unremarkable. Stomach/Bowel: Bowel shows no evidence of obstruction, ileus, inflammation or obvious lesion. The appendix is normal. No free intraperitoneal air. Sparse  diverticulosis primarily the transverse colon. No evidence of acute diverticulitis. The study was not performed as a CTA for GI bleed. No overt obvious contrast extravasation into the GI tract. Vascular/Lymphatic: No significant vascular findings are present. No enlarged abdominal or pelvic lymph nodes. No varices identified. Reproductive: Prostate is unremarkable. Other: No abdominal wall hernia or abnormality. No abdominopelvic ascites. Musculoskeletal: No acute or significant osseous findings. IMPRESSION: 1. Stable diffuse hepatic steatosis without overt cirrhotic morphology of the liver. 2. Sparse diverticulosis primarily of the transverse colon without evidence of acute diverticulitis. 3. The study was not performed as a CTA for GI bleed. No overt obvious contrast extravasation into the GI tract. Electronically Signed   By: Irish Lack M.D.   On: 09/10/2023 08:26   CT Head Wo Contrast Result Date: 08/29/2023 CLINICAL DATA:  Head trauma, moderate-severe. Fall. Hit head. Headache. EXAM: CT HEAD WITHOUT CONTRAST TECHNIQUE: Contiguous axial images were obtained from the base of the skull through the vertex without intravenous contrast. RADIATION DOSE REDUCTION: This exam was performed according to the departmental dose-optimization program which includes automated exposure control, adjustment of the mA and/or kV according to patient size and/or use of iterative reconstruction technique. COMPARISON:  03/12/2008 FINDINGS: Brain: No acute intracranial abnormality. Specifically, no hemorrhage, hydrocephalus, mass lesion, acute infarction, or significant intracranial injury. Vascular: No hyperdense vessel or unexpected calcification. Skull: No acute calvarial abnormality. Sinuses/Orbits: No acute findings Other: None IMPRESSION: No acute intracranial abnormality. Electronically Signed   By: Charlett Nose M.D.   On: 08/29/2023 01:19   DG Ribs Unilateral W/Chest Right Result Date: 08/29/2023 CLINICAL DATA:  Right  rib pain after fall. EXAM: RIGHT RIBS AND CHEST - 3+ VIEW COMPARISON:  10/15/2019 FINDINGS: Normal cardiomediastinal silhouette. No focal consolidation, pleural effusion, or pneumothorax. No displaced rib fractures. IMPRESSION: No displaced rib fractures. Electronically Signed   By: Minerva Fester M.D.   On: 08/29/2023 01:07    Labs: BNP (last 3 results) No results for input(s): "BNP" in the last 8760 hours. Basic Metabolic Panel: Recent Labs  Lab 09/10/23 0513 09/11/23 0330  NA 133* 136  K 3.9 3.8  CL 100 100  CO2 20* 27  GLUCOSE 279* 149*  BUN 8 6  CREATININE 1.06 0.81  CALCIUM 9.8 9.3   Liver Function Tests: Recent Labs  Lab 09/10/23 0513 09/11/23 0330  AST 42* 23  ALT 32 21  ALKPHOS 87 49  BILITOT 1.0 1.8*  PROT 7.7 6.4*  ALBUMIN 4.1 3.4*   No results for input(s): "LIPASE", "AMYLASE" in the last 168 hours. No results for input(s): "AMMONIA" in the last 168 hours. CBC: Recent Labs  Lab 09/10/23 0513 09/10/23 1412 09/10/23 2020 09/11/23 0330  WBC 8.8  --   --  6.7  HGB 16.7 14.6 15.2 14.5  HCT 47.6 42.8 43.3 40.6  MCV 90.3  --   --  89.6  PLT 237  --   --  181   Cardiac Enzymes: No results for input(s): "CKTOTAL", "CKMB", "CKMBINDEX", "TROPONINI" in the last 168 hours. BNP: Invalid input(s): "POCBNP" CBG: Recent Labs  Lab 09/10/23 1118 09/10/23 1617 09/11/23 0728 09/11/23 1209  GLUCAP 190* 134* 179* 246*  D-Dimer No results for input(s): "DDIMER" in the last 72 hours. Hgb A1c Recent Labs    09/10/23 1412  HGBA1C 7.7*   Urinalysis    Component Value Date/Time   COLORURINE YELLOW 09/10/2023 0621   APPEARANCEUR CLEAR 09/10/2023 0621   LABSPEC 1.020 09/10/2023 0621   PHURINE 5.0 09/10/2023 0621   GLUCOSEU >=500 (A) 09/10/2023 0621   HGBUR NEGATIVE 09/10/2023 0621   BILIRUBINUR NEGATIVE 09/10/2023 0621   KETONESUR NEGATIVE 09/10/2023 0621   PROTEINUR 30 (A) 09/10/2023 0621   NITRITE NEGATIVE 09/10/2023 0621   LEUKOCYTESUR NEGATIVE  09/10/2023 0621   Sepsis Labs Recent Labs  Lab 09/10/23 0513 09/11/23 0330  WBC 8.8 6.7   Microbiology No results found for this or any previous visit (from the past 240 hours).   Time coordinating discharge: 25 minutes  SIGNED: Lanae Boast, MD  [facility] 09/11/2023, 12:58 PM  If 7PM-7AM, please contact night-coverage [email or web address]

## 2024-03-26 ENCOUNTER — Emergency Department (HOSPITAL_COMMUNITY)
Admission: EM | Admit: 2024-03-26 | Discharge: 2024-03-26 | Disposition: A | Discharge status: Home / Self Care | Attending: Emergency Medicine | Admitting: Emergency Medicine

## 2024-03-26 ENCOUNTER — Other Ambulatory Visit: Payer: Self-pay

## 2024-03-26 ENCOUNTER — Emergency Department (HOSPITAL_COMMUNITY)

## 2024-03-26 DIAGNOSIS — R079 Chest pain, unspecified: Secondary | ICD-10-CM | POA: Diagnosis present

## 2024-03-26 DIAGNOSIS — J449 Chronic obstructive pulmonary disease, unspecified: Secondary | ICD-10-CM | POA: Insufficient documentation

## 2024-03-26 DIAGNOSIS — Z7984 Long term (current) use of oral hypoglycemic drugs: Secondary | ICD-10-CM | POA: Diagnosis not present

## 2024-03-26 DIAGNOSIS — Z79899 Other long term (current) drug therapy: Secondary | ICD-10-CM | POA: Diagnosis not present

## 2024-03-26 DIAGNOSIS — I10 Essential (primary) hypertension: Secondary | ICD-10-CM | POA: Insufficient documentation

## 2024-03-26 DIAGNOSIS — Z720 Tobacco use: Secondary | ICD-10-CM | POA: Insufficient documentation

## 2024-03-26 DIAGNOSIS — E119 Type 2 diabetes mellitus without complications: Secondary | ICD-10-CM | POA: Insufficient documentation

## 2024-03-26 DIAGNOSIS — R0602 Shortness of breath: Secondary | ICD-10-CM | POA: Diagnosis not present

## 2024-03-26 LAB — CBC
HCT: 53.7 % — ABNORMAL HIGH (ref 39.0–52.0)
Hemoglobin: 18.2 g/dL — ABNORMAL HIGH (ref 13.0–17.0)
MCH: 31.6 pg (ref 26.0–34.0)
MCHC: 33.9 g/dL (ref 30.0–36.0)
MCV: 93.2 fL (ref 80.0–100.0)
Platelets: 176 K/uL (ref 150–400)
RBC: 5.76 MIL/uL (ref 4.22–5.81)
RDW: 11.8 % (ref 11.5–15.5)
WBC: 6.5 K/uL (ref 4.0–10.5)
nRBC: 0 % (ref 0.0–0.2)

## 2024-03-26 LAB — BASIC METABOLIC PANEL WITH GFR
Anion gap: 15 (ref 5–15)
BUN: 5 mg/dL — ABNORMAL LOW (ref 6–20)
CO2: 20 mmol/L — ABNORMAL LOW (ref 22–32)
Calcium: 9.8 mg/dL (ref 8.9–10.3)
Chloride: 102 mmol/L (ref 98–111)
Creatinine, Ser: 0.76 mg/dL (ref 0.61–1.24)
GFR, Estimated: >60 mL/min (ref >60)
Glucose, Bld: 258 mg/dL — ABNORMAL HIGH (ref 70–99)
Potassium: 3.9 mmol/L (ref 3.5–5.1)
Sodium: 137 mmol/L (ref 135–145)

## 2024-03-26 LAB — HEPATIC FUNCTION PANEL
ALT: 33 U/L (ref 0–44)
AST: 48 U/L — ABNORMAL HIGH (ref 15–41)
Albumin: 3.7 g/dL (ref 3.5–5.0)
Alkaline Phosphatase: 79 U/L (ref 38–126)
Bilirubin, Direct: 0.2 mg/dL (ref 0.0–0.2)
Indirect Bilirubin: 0.9 mg/dL (ref 0.3–0.9)
Total Bilirubin: 1.1 mg/dL (ref 0.0–1.2)
Total Protein: 7.5 g/dL (ref 6.5–8.1)

## 2024-03-26 LAB — TROPONIN I (HIGH SENSITIVITY)
Troponin I (High Sensitivity): 5 ng/L (ref <18)
Troponin I (High Sensitivity): 13 ng/L (ref <18)

## 2024-03-26 LAB — CBG MONITORING, ED: Glucose-Capillary: 212 mg/dL — ABNORMAL HIGH (ref 70–99)

## 2024-03-26 LAB — BRAIN NATRIURETIC PEPTIDE: B Natriuretic Peptide: 7.4 pg/mL (ref 0.0–100.0)

## 2024-03-26 LAB — PROTIME-INR
INR: 1 (ref 0.8–1.2)
Prothrombin Time: 13.5 s (ref 11.4–15.2)

## 2024-03-26 LAB — LIPASE, BLOOD: Lipase: 36 U/L (ref 11–51)

## 2024-03-26 MED ORDER — MORPHINE SULFATE (PF) 4 MG/ML IV SOLN: 4.0000 mg | Freq: Once | INTRAVENOUS | Status: DC

## 2024-03-26 MED ORDER — PANTOPRAZOLE SODIUM 20 MG PO TBEC: 20.0000 mg | DELAYED_RELEASE_TABLET | Freq: Every day | ORAL | 0 refills | Status: AC

## 2024-03-26 MED ORDER — SUCRALFATE 1 G PO TABS: 1.0000 g | ORAL_TABLET | Freq: Three times a day (TID) | ORAL | 0 refills | Status: AC

## 2024-03-26 MED ORDER — MORPHINE SULFATE (PF) 4 MG/ML IV SOLN
4.0000 mg | Freq: Once | INTRAVENOUS | Status: AC
  Administered 2024-03-26: 4 mg via INTRAMUSCULAR
  Filled 2024-03-26: qty 1

## 2024-03-26 MED ORDER — ALUM & MAG HYDROXIDE-SIMETH 200-200-20 MG/5ML PO SUSP
30.0000 mL | Freq: Once | ORAL | Status: AC
Start: 2024-03-26 — End: 2024-03-26
  Administered 2024-03-26: 30 mL via ORAL
  Filled 2024-03-26: qty 30

## 2024-03-26 NOTE — ED Notes (Signed)
 RN attempted to collect tropin twice. Once in left [facility] and once in left hand.  Phlebotomy notified

## 2024-03-26 NOTE — ED Provider Notes (Signed)
 [name] [facility]ERGENC[facility] DEPARTMENT AT [facility] Provider Note   CSN: [id number] Arrival date & time: 03/26/24  [id number]     Patient presents with: Chest Pain   Louis Gibson is a 58 y.o. male.   The history is provided by the patient and medical records. No language interpreter was used.  Chest Pain    58 year old male history of hypertension, diabetes, COPD, tobacco use, alcohol use presents today with complaints of chest pain.  Patient reports developing pain across his chest around midday yesterday that came and went and the pain returns this morning prompting this ER visit.  Described pain as an uncomfortable sensation across his chest nonradiating seems to be worse when he breathes.  Rates pain between 6-10.  Pain is not associate with lightheadedness, dizziness, nausea, diaphoresis or any significant shortness of breath.  Denies any abdominal pain or back pain.  He did admits to drinking 1/5 of alcohol yesterday in celebration of his birthday today.  However states his pain came before his alcohol consumption.  Endorses indigestion.  He denies any significant cardiac history.  Has had history of stomach ulcers in the past related to regular BC powder use.  No prior history of PE or DVT.  Prior to Admission medications   Medication Sig Start Date End Date Taking? Authorizing Provider  amLODipine  (NORVASC ) 10 MG tablet Take 10 mg by mouth daily. 05/28/21   [provider]  cholecalciferol (VITAMIN D3) 25 MCG (1000 UNIT) tablet Take 1,000 Units by mouth daily.    [provider]  CINNAMON PO Take 1,000 mg by mouth daily.    [provider]  folic acid  (FOLVITE ) 800 MCG tablet Take 800 mcg by mouth daily.    [provider]  gabapentin (NEURONTIN) 100 MG capsule Take 100 mg by mouth daily as needed (nerve pain). 04/25/21   [provider]  glimepiride (AMARYL) 4 MG tablet Take 4 mg by mouth daily. 11/21/20   [provider]   hydrochlorothiazide  (HYDRODIURIL ) 12.5 MG tablet Take 12.5 mg by mouth daily. 11/09/20   [provider]  JANUVIA 100 MG tablet Take 100 mg by mouth daily. 11/21/20   [provider]  pantoprazole  (PROTONIX ) 20 MG tablet Take 2 tablets (40 mg total) by mouth daily. Patient taking differently: Take 20 mg by mouth daily. 08/21/21 09/10/23  Chatterjee, Srobona Tublu, MD  thiamine  (VITAMIN B-1) 100 MG tablet Take 100 mg by mouth daily.    [provider]  tiZANidine (ZANAFLEX) 4 MG tablet Take 4 mg by mouth 2 (two) times daily as needed for muscle spasms. 09/03/23   [provider]  TURMERIC EXTRA STRENGTH PO Take 1,000 mg by mouth daily.    [provider]    Allergies: Egg-derived products    Review of Systems  Cardiovascular:  Positive for chest pain.  All other systems reviewed and are negative.   Updated Vital Signs BP (!) 146/90   Pulse 83   Temp 98.1 F (36.7 C) (Oral)   Resp 17   SpO2 100%   Physical Exam Constitutional:      General: He is not in acute distress.    Appearance: He is well-developed.  HENT:     Head: Atraumatic.  Eyes:     Conjunctiva/sclera: Conjunctivae normal.  Cardiovascular:     Rate and Rhythm: Normal rate and regular rhythm.     Pulses: Normal pulses.     Heart sounds: Normal heart sounds.  Pulmonary:  Effort: Pulmonary effort is normal.     Breath sounds: Normal breath sounds. No wheezing, rhonchi or rales.  Abdominal:     Palpations: Abdomen is soft.     Tenderness: There is no abdominal tenderness.  Musculoskeletal:        General: Normal range of motion.     Cervical back: Normal range of motion and neck supple.  Skin:    Findings: No rash.  Neurological:     Mental Status: He is alert.     (all labs ordered are listed, but only abnormal results are displayed) Labs Reviewed  BASIC METABOLIC PANEL WITH GFR - Abnormal; Notable for the following components:      Result Value   CO2 20 (*)     Glucose, Bld 258 (*)    BUN 5 (*)    All other components within normal limits  CBC - Abnormal; Notable for the following components:   Hemoglobin 18.2 (*)    HCT 53.7 (*)    All other components within normal limits  HEPATIC FUNCTION PANEL - Abnormal; Notable for the following components:   AST 48 (*)    All other components within normal limits  CBG MONITORING, ED - Abnormal; Notable for the following components:   Glucose-Capillary 212 (*)    All other components within normal limits  PROTIME-INR  BRAIN NATRIURETIC PEPTIDE  LIPASE, BLOOD  TROPONIN I (HIGH SENSITIVITY)  TROPONIN I (HIGH SENSITIVITY)    EKG: EKG Interpretation Date/Time:  Wednesday March 26 2024 05:39:37 EDT Ventricular Rate:  86 PR Interval:  172 QRS Duration:  92 QT Interval:  358 QTC Calculation: 428 R Axis:   73  Text Interpretation: Normal sinus rhythm Septal infarct , age undetermined T wave abnormality, consider inferior ischemia Abnormal ECG When compared with ECG of 29-Aug-2023 00:44, PREVIOUS ECG IS PRESENT No significant change was found Confirmed by Trine Likes [phone]) on 03/26/2024 5:43:27 AM  Radiology: [facility] Chest 2 View Result Date: 03/26/2024 CLINICAL DATA:  Central chest pain with shortness of breath. EXAM: CHEST - 2 VIEW COMPARISON:  PA chest and rib series 08/29/2023. FINDINGS: The heart size and mediastinal contours are within normal limits. Both lungs are clear. The visualized skeletal structures are intact, with thoracic spondylosis. IMPRESSION: No active cardiopulmonary disease.  Unchanged. Electronically Signed   By: Francis Quam M.D.   On: 03/26/2024 06:13     Procedures   Medications Ordered in the ED  alum & mag hydroxide-simeth (MAALOX/MYLANTA) 200-200-20 MG/5ML suspension 30 mL (30 mLs Oral Given 03/26/24 0700)  morphine  (PF) 4 MG/ML injection 4 mg (4 mg Intramuscular Given 03/26/24 0924)                HEART Score: 3                    Medical Decision Making Amount and/or  Complexity of Data Reviewed Labs: ordered. Radiology: ordered.  Risk OTC drugs. Prescription drug management.   BP (!) 146/90   Pulse 83   Temp 98.1 F (36.7 C) (Oral)   Resp 17   SpO2 100%   67:74 AM   58 year old male history of hypertension, diabetes, COPD, tobacco use, alcohol use presents today with complaints of chest pain.  Patient reports developing pain across his chest around midday yesterday that came and went and the pain returns this morning prompting this ER visit.  Described pain as an uncomfortable sensation across his chest nonradiating seems to be worse when he breathes.  Rates pain between 6-10.  Pain is not associate with lightheadedness, dizziness, nausea, diaphoresis or any significant shortness of breath.  Denies any abdominal pain or back pain.  He did admits to drinking 1/5 of alcohol yesterday in celebration of his birthday today.  However states his pain came before his alcohol consumption.  Endorses indigestion.  He denies any significant cardiac history.  Has had history of stomach ulcers in the past related to regular BC powder use.  No prior history of PE or DVT.  On exam, patient is resting comfortably appears to be in no acute discomfort.  Heart with normal rate and rhythm, lungs clear abdomen is soft nontender no CVA tenderness.  Bowel sounds present.  He is mentating appropriately.  -Labs ordered, independently viewed and interpreted by me.  Labs remarkable for elevated CBG of 212 without any anion gap concerning for DKA.  Negative delta troponin. -The patient was maintained on a cardiac monitor.  I personally viewed and interpreted the cardiac monitored which showed an underlying rhythm of: NSR -Imaging independently viewed and interpreted by me and I agree with radiologist's interpretation.  Result remarkable for CXR unremarkable -This patient presents to the ED for concern of CP, this involves an extensive number of treatment options, and is a complaint  that carries with it a high risk of complications and morbidity.  The differential diagnosis includes ACS, GERD, gastritis, MSK, PE, PNA, Shingles, costochondritis,  -Co morbidities that complicate the patient evaluation includes DM, COPD, HTN, alcohol use -Treatment includes GI cocktail, morphine  -Reevaluation of the patient after these medicines showed that the patient improved -PCP office notes or outside notes reviewed -Escalation to admission/observation considered: patients feels much better, is comfortable with discharge, and will follow up with cardiology -Prescription medication considered, patient comfortable with pantoprazole  and carafate  -Social Determinant of Health considered which includes alcohol abuse       Final diagnoses:  Nonspecific chest pain    ED Discharge Orders          Ordered    pantoprazole  (PROTONIX ) 20 MG tablet  Daily        03/26/24 1009    sucralfate  (CARAFATE ) 1 g tablet  3 times daily with meals & bedtime        03/26/24 1009    Ambulatory referral to Cardiology       Comments: If you have not heard from the Cardiology office within the next 72 hours please call [phone].   03/26/24 1009               Nivia Colon, PA-C 03/26/24 1010    Doretha Folks, MD 03/29/24 [phone]

## 2024-03-26 NOTE — ED Triage Notes (Signed)
Patient reports central chest pain onset yesterday with mild SOB , no emesis or diaphoresis .

## 2024-03-26 NOTE — Discharge Instructions (Signed)
 You have been evaluated for your chest discomfort.  Fortunately no concerning findings were noted on today's exam.  Your blood sugar is mildly elevated, please monitor your blood sugar carefully and taking medication appropriately.  Cardiology office will reach out to you for an outpatient follow-up for further care.  Take medication prescribed.  Return if you have any concern.  Happy Birthday to you

## 2024-05-16 ENCOUNTER — Ambulatory Visit: Admitting: Internal Medicine

## 2024-07-14 ENCOUNTER — Ambulatory Visit: Attending: Internal Medicine | Admitting: Internal Medicine

## 2024-07-14 ENCOUNTER — Encounter: Payer: Self-pay | Admitting: Internal Medicine

## 2024-07-14 VITALS — BP 142/84 | HR 84 | Ht 72.0 in | Wt 267.2 lb

## 2024-07-14 DIAGNOSIS — E119 Type 2 diabetes mellitus without complications: Secondary | ICD-10-CM | POA: Diagnosis not present

## 2024-07-14 DIAGNOSIS — F1010 Alcohol abuse, uncomplicated: Secondary | ICD-10-CM

## 2024-07-14 DIAGNOSIS — Z720 Tobacco use: Secondary | ICD-10-CM | POA: Diagnosis not present

## 2024-07-14 DIAGNOSIS — I10 Essential (primary) hypertension: Secondary | ICD-10-CM

## 2024-07-14 DIAGNOSIS — E669 Obesity, unspecified: Secondary | ICD-10-CM

## 2024-07-14 DIAGNOSIS — R079 Chest pain, unspecified: Secondary | ICD-10-CM | POA: Diagnosis not present

## 2024-07-14 MED ORDER — METOPROLOL TARTRATE 50 MG PO TABS: 50.0000 mg | ORAL_TABLET | Freq: Once | ORAL | 0 refills | Status: AC

## 2024-07-14 MED ORDER — ASPIRIN 81 MG PO TBEC: 81.0000 mg | DELAYED_RELEASE_TABLET | Freq: Every day | ORAL | 0 refills | Status: AC

## 2024-07-14 MED ORDER — ROSUVASTATIN CALCIUM 10 MG PO TABS: 10.0000 mg | ORAL_TABLET | Freq: Every day | ORAL | 0 refills | Status: DC

## 2024-07-14 NOTE — Patient Instructions (Signed)
 Medication Instructions:  START Aspirin  81 mg once daily   START Metoprolol -tartrate (lopressor ) 50 mg once daily  START Rosuvastatin  (Crestor ) 10 mg once daily  *If you need a refill on your cardiac medications before your next appointment, please call your pharmacy*  Lab Work: BMET and Lipid when CT is scheduled. Go a week or 2 before.  If you have labs (blood work) drawn today and your tests are completely normal, you will receive your results only by: MyChart Message (if you have MyChart) OR A paper copy in the mail If you have any lab test that is abnormal or we need to change your treatment, we will call you to review the results.  Testing/Procedures: Your physician has requested that you have an echocardiogram. Echocardiography is a painless test that uses sound waves to create images of your heart. It provides your doctor with information about the size and shape of your heart and how well your heart's chambers and valves are working. This procedure takes approximately one hour. There are no restrictions for this procedure. Please do NOT wear cologne, perfume, aftershave, or lotions (deodorant is allowed). Please arrive 15 minutes prior to your appointment time.  Please note: We ask at that you not bring children with you during ultrasound (echo/ vascular) testing. Due to room size and safety concerns, children are not allowed in the ultrasound rooms during exams. Our front office staff cannot provide observation of children in our lobby area while testing is being conducted. An adult accompanying a patient to their appointment will only be allowed in the ultrasound room at the discretion of the ultrasound technician under special circumstances. We apologize for any inconvenience.   Your physician has requested that you have cardiac CT. Cardiac computed tomography (CT) is a painless test that uses an x-ray machine to take clear, detailed pictures of your heart. For further information  please visit [email or web address] Please follow instruction sheet as given.    Follow-Up: At [facility] you and your health needs are our priority.  As part of our continuing mission to provide you with exceptional heart care, our providers are all part of one team.  This team includes your primary Cardiologist (physician) and Advanced Practice Providers or APPs (Physician Assistants and Nurse Practitioners) who all work together to provide you with the care you need, when you need it.  Your next appointment:   As needed after tests  Provider:   Emeline FORBES Calender, MD    We recommend signing up for the patient portal called MyChart.  Sign up information is provided on this After Visit Summary.  MyChart is used to connect with patients for Virtual Visits (Telemedicine).  Patients are able to view lab/test results, encounter notes, upcoming appointments, etc.  Non-urgent messages can be sent to your provider as well.   To learn more about what you can do with MyChart, go to [email or web address].   Other Instructions  How to Prepare for Your Cardiac PET/CT Stress Test:  1. Please do not take these medications before your test:  ~Medications that may interfere with the cardiac pharmacological stress agent (ex. nitrates - including erectile dysfunction medications, isosorbide mononitrate, tamulosin or beta-blockers) the day of the exam. (Erectile dysfunction medication should be held for at least 72 hrs prior to test) ~Theophylline containing medications for 12 hours. ~Dipyridamole 48 hours prior to the test. ~Your remaining medications may be taken with water.  2. Nothing to eat or drink, except water, 3 hours prior to arrival  time.   ~ NO caffeine/decaffeinated products, or chocolate 12 hours prior to arrival.  3. NO perfume, cologne or lotion on chest or abdomen area.  4. Total time is 1 to 2 hours; you may want to bring reading material for the waiting time.  Please report  to Radiology at the [facility] Main Entrance 30 minutes early for your test. [address] [place], [place] [address]  OR  Please report to Radiology at [facility] Main Entrance, medical mall, 30 mins prior to your test. [address] [place], [place] [phone]  Diabetic Preparation: - Hold oral medications. - You may take NPH and Lantus insulin . - Do not take Humalog or Humulin R  (Regular Insulin ) the day of your test. - Check blood sugars prior to leaving the house. - If able to eat breakfast prior to 3 hour fasting, you may take all medications, including your insulin , - Do not worry if you miss your breakfast dose of insulin  - start at your next meal. - Patients who wear a continuous glucose monitor MUST remove the device prior to scanning.  In preparation for your appointment, medication and supplies will be purchased.  Appointment availability is limited, so if you need to cancel or reschedule, please call the Radiology Department at [phone] [facility]) OR [phone] [facility])  24 hours in advance to avoid a cancellation fee of $100.00  What to Expect After you Arrive:  Once you arrive and check in for your appointment, you will be taken to a preparation room within the Radiology Department.  A technologist or Nurse will obtain your medical history, verify that you are correctly prepped for the exam, and explain the procedure.  Afterwards,  an IV will be started in your arm and electrodes will be placed on your skin for EKG monitoring during the stress portion of the exam. Then you will be escorted to the PET/CT scanner.  There, staff will get you positioned on the scanner and obtain a blood pressure and EKG.  During the exam, you will continue to be connected to the EKG and blood pressure machines.  A small, safe amount of a radioactive tracer will be injected in your IV to obtain a series of pictures of your heart along with an  injection of a stress agent.    After your Exam:  It is recommended that you eat a meal and drink a caffeinated beverage to counter act any effects of the stress agent.  Drink plenty of fluids for the remainder of the day and urinate frequently for the first couple of hours after the exam.  Your doctor will inform you of your test results within 7-10 business days.  For more information and frequently asked questions, please visit our website : [email or web address]  For questions about your test or how to prepare for your test, please call: Cardiac Imaging Nurse Navigators Office: [phone]

## 2024-07-14 NOTE — Progress Notes (Signed)
 Cardiology Office Note   Date:  07/14/2024  ID:  Louis Gibson, DOB 06-27-1966, MRN [id number] PCP: Shelda Atlas, MD  [facility] Providers Cardiologist:  Emeline FORBES Calender, MD     History of Present Illness Louis Gibson is a 58 y.o. male with a past medical history of hypertension, diabetes, COPD, tobacco use, alcohol abuse who was in the ED on 03/26/2024 with complaints of nonradiating chest pain which was worse with inspiration.    Today he says that since that time he had another episode of chest tightness across his chest that occurred while lifting something heavy at work.  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  He also had an episode of chest discomfort several years ago with an unremarkable workup.  He smokes half a cigar daily and drinks 1 pint of liquor daily and 1/5 of liquor on weekends.  He is trying to quit.     ROS:  Review of Systems  All other systems reviewed and are negative.   Physical Exam  Physical Exam Vitals and nursing note reviewed.  Constitutional:      Appearance: Normal appearance.  HENT:     Head: Normocephalic and atraumatic.  Eyes:     Conjunctiva/sclera: Conjunctivae normal.  Neck:     Vascular: No carotid bruit.  Cardiovascular:     Rate and Rhythm: Normal rate and regular rhythm.  Pulmonary:     Effort: Pulmonary effort is normal.     Breath sounds: Normal breath sounds.  Musculoskeletal:        General: No swelling or tenderness.  Skin:    Coloration: Skin is not jaundiced or pale.  Neurological:     Mental Status: He is alert.     VS:  BP (!) 142/84   Pulse 84   Ht 6' (1.829 m)   Wt 267 lb 3.2 oz (121.2 kg)   SpO2 97%   BMI 36.24 kg/m         Wt Readings from Last 3 Encounters:  07/14/24 267 lb 3.2 oz (121.2 kg)  09/10/23 280 lb (127 kg)  03/07/23 285 lb (129.3 kg)     EKG Interpretation Date/Time:  Monday July 14 2024 13:51:22 EST Ventricular Rate:  84 PR Interval:  178 QRS Duration:  88 QT  Interval:  362 QTC Calculation: 427 R Axis:   112  Text Interpretation: Normal sinus rhythm Lateral infarct , age undetermined ST & T wave abnormality, consider inferior ischemia When compared with ECG of 26-Mar-2024 05:39, QRS axis Shifted right Nonspecific T wave abnormality no longer evident in Anterolateral leads Confirmed by Calender Emeline [phone]) on 07/14/2024 2:00:29 PM    Studies Reviewed   Echo 08/11/2015 EF 55 to 60% with mild concentric LVH Grade 2 diastolic dysfunction     Risk Assessment/Calculations          ASCVD risk score: The 10-year ASCVD risk score (Arnett DK, et al., 2019) is: 42.3%   Values used to calculate the score:     Age: 89 years     Clincally relevant sex: Male     Is Non-Hispanic African American: Yes     Diabetic: Yes     Tobacco smoker: Yes     Systolic Blood Pressure: 142 mmHg     Is BP treated: Yes     HDL Cholesterol: 64 mg/dL     Total Cholesterol: 215 mg/dL   ASSESSMENT  Atypical chest pain likely noncardiac however does have an abnormal EKG with septal infarct pattern  and inferior ST changes.  Also has risk factors including tobacco use, diabetes and an ASCVD risk of 42%.  Therefore we will need to rule out underlying CAD Hypertension stable on amlodipine  10 mg, HCTZ 12.5 mg Tobacco use advised cessation Alcohol abuse advised cessation COPD Type 2 diabetes with obesity   Plan  Echocardiogram Coronary CTA with BMP and metoprolol  tartrate prior to the study Start aspirin  81 mg Start rosuvastatin  10 mg Lipid panel Advised tobacco and alcohol cessation  Follow up: Pending above workup, likely in 1 year with PA          Signed, Emeline FORBES Calender, MD

## 2024-07-15 ENCOUNTER — Other Ambulatory Visit: Payer: Self-pay

## 2024-07-15 DIAGNOSIS — R079 Chest pain, unspecified: Secondary | ICD-10-CM

## 2024-07-15 NOTE — Telephone Encounter (Signed)
 [facility] mail order pharmacy requesting clarification for medication rosuvastatin . Pharmacy stating that pt has a history of alcoholic cirrhosis pf the liver and medication prescribed should be used with caution or its contraindicated with certain medical conditions. Is prescriber aware of confirtion and is monitoring and would like to continue prescribing medication? Please address   Inv# [id number]    Ph# [phone]

## 2024-07-22 ENCOUNTER — Other Ambulatory Visit: Payer: Self-pay | Admitting: Internal Medicine

## 2024-07-22 DIAGNOSIS — R079 Chest pain, unspecified: Secondary | ICD-10-CM

## 2024-07-24 ENCOUNTER — Other Ambulatory Visit: Payer: Self-pay

## 2024-07-24 DIAGNOSIS — Z79899 Other long term (current) drug therapy: Secondary | ICD-10-CM

## 2024-07-24 NOTE — Telephone Encounter (Signed)
 I called the patient and let him know that Dr. Kriste wants to do some blood work before he starts taking Rosuvastatin  10 mg daily. The patient stated he could get that done next week and based off of those results we will be in contact with him about his medication. Patient demonstrated understanding and he was appreciative of the clarification.  The refill for his rosuvastatin  was placed on 07/22/24 by April Harrington and I placed the lab order on 07/24/24.

## 2024-07-24 NOTE — Progress Notes (Signed)
 Hepatic function panel order was placed for medication management per Dr. Kriste.

## 2024-07-28 MED ORDER — ROSUVASTATIN CALCIUM 10 MG PO TABS: 10.0000 mg | ORAL_TABLET | Freq: Every day | ORAL | 3 refills | Status: DC

## 2024-07-31 LAB — BASIC METABOLIC PANEL WITH GFR
BUN/Creatinine Ratio: 11 (ref 10–24)
BUN: 9 mg/dL (ref 8–27)
CO2: 22 mmol/L (ref 20–29)
Calcium: 9.9 mg/dL (ref 8.6–10.2)
Chloride: 102 mmol/L (ref 96–106)
Creatinine, Ser: 0.83 mg/dL (ref 0.76–1.27)
Glucose: 140 mg/dL — ABNORMAL HIGH (ref 70–99)
Potassium: 3.9 mmol/L (ref 3.5–5.2)
Sodium: 140 mmol/L (ref 134–144)
eGFR: 100 mL/min/1.73 (ref >59)

## 2024-07-31 LAB — LIPID PANEL
Chol/HDL Ratio: 3.1 ratio (ref 0.0–5.0)
Cholesterol, Total: 197 mg/dL (ref 100–199)
HDL: 63 mg/dL (ref >39)
LDL Chol Calc (NIH): 104 mg/dL — ABNORMAL HIGH (ref 0–99)
Triglycerides: 175 mg/dL — ABNORMAL HIGH (ref 0–149)
VLDL Cholesterol Cal: 30 mg/dL (ref 5–40)

## 2024-08-01 ENCOUNTER — Encounter (HOSPITAL_COMMUNITY): Payer: Self-pay

## 2024-08-05 ENCOUNTER — Ambulatory Visit (HOSPITAL_COMMUNITY)
Admission: RE | Admit: 2024-08-05 | Discharge: 2024-08-05 | Disposition: A | Source: Ambulatory Visit | Attending: Internal Medicine

## 2024-08-05 DIAGNOSIS — R079 Chest pain, unspecified: Secondary | ICD-10-CM | POA: Insufficient documentation

## 2024-08-05 MED ORDER — DILTIAZEM HCL 25 MG/5ML IV SOLN: 10.0000 mg | INTRAVENOUS | Status: DC | PRN

## 2024-08-05 MED ORDER — METOPROLOL TARTRATE 5 MG/5ML IV SOLN
10.0000 mg | Freq: Once | INTRAVENOUS | Status: AC | PRN
  Administered 2024-08-05: 14:00:00 10 mg via INTRAVENOUS

## 2024-08-05 MED ORDER — NITROGLYCERIN 0.4 MG SL SUBL
0.8000 mg | SUBLINGUAL_TABLET | Freq: Once | SUBLINGUAL | Status: AC
  Administered 2024-08-05: 14:00:00 0.8 mg via SUBLINGUAL

## 2024-08-05 MED ORDER — IOHEXOL 350 MG/ML SOLN
100.0000 mL | Freq: Once | INTRAVENOUS | Status: AC | PRN
  Administered 2024-08-05: 14:00:00 100 mL via INTRAVENOUS

## 2024-08-07 ENCOUNTER — Ambulatory Visit: Payer: Self-pay | Admitting: Internal Medicine

## 2024-08-13 ENCOUNTER — Ambulatory Visit (HOSPITAL_COMMUNITY)
Admission: RE | Admit: 2024-08-13 | Discharge: 2024-08-13 | Disposition: A | Discharge status: Home / Self Care | Source: Ambulatory Visit | Attending: Internal Medicine | Admitting: Internal Medicine

## 2024-08-13 DIAGNOSIS — R079 Chest pain, unspecified: Secondary | ICD-10-CM | POA: Diagnosis present

## 2024-08-13 LAB — ECHOCARDIOGRAM COMPLETE
AR max vel: 4.35 cm2
AV Area VTI: 4.32 cm2
AV Area mean vel: 4.22 cm2
AV Mean grad: 3 mmHg
AV Peak grad: 5.3 mmHg
Ao pk vel: 1.15 m/s
Area-P 1/2: 4.89 cm2
MV M vel: 1.01 m/s
MV Peak grad: 4.1 mmHg
S' Lateral: 2.18 cm

## 2024-09-01 NOTE — Progress Notes (Signed)
 Coronary CTA shows calcium  score of 178 which puts the patient at the 90th percentile of his cohort with mild nonobstructive plaque in the LAD and RCA.  Chest pain is unlikely cardiac in nature given these findings and his unremarkable echo.  He should continue on aspirin  and rosuvastatin  with goal LDL less than 70 and repeat a lipid panel in February to evaluate efficacy of rosuvastatin .  Otherwise he can follow-up in 1 year and continue with lifestyle modification as previously discussed.  Thank you, Emeline Calender, DO

## 2024-09-03 ENCOUNTER — Encounter (HOSPITAL_COMMUNITY): Payer: Self-pay

## 2024-09-03 ENCOUNTER — Emergency Department (HOSPITAL_COMMUNITY)

## 2024-09-03 ENCOUNTER — Emergency Department (HOSPITAL_COMMUNITY)
Admission: EM | Admit: 2024-09-03 | Discharge: 2024-09-03 | Disposition: A | Discharge status: Home / Self Care | Attending: Emergency Medicine | Admitting: Emergency Medicine

## 2024-09-03 ENCOUNTER — Other Ambulatory Visit: Payer: Self-pay

## 2024-09-03 DIAGNOSIS — M5442 Lumbago with sciatica, left side: Secondary | ICD-10-CM | POA: Diagnosis not present

## 2024-09-03 DIAGNOSIS — W182XXA Fall in (into) shower or empty bathtub, initial encounter: Secondary | ICD-10-CM | POA: Insufficient documentation

## 2024-09-03 DIAGNOSIS — M545 Low back pain, unspecified: Secondary | ICD-10-CM | POA: Diagnosis present

## 2024-09-03 DIAGNOSIS — Z7982 Long term (current) use of aspirin: Secondary | ICD-10-CM | POA: Diagnosis not present

## 2024-09-03 DIAGNOSIS — W19XXXA Unspecified fall, initial encounter: Secondary | ICD-10-CM

## 2024-09-03 LAB — COMPREHENSIVE METABOLIC PANEL WITH GFR
ALT: 48 U/L — ABNORMAL HIGH (ref 0–44)
AST: 48 U/L — ABNORMAL HIGH (ref 15–41)
Albumin: 4.2 g/dL (ref 3.5–5.0)
Alkaline Phosphatase: 88 U/L (ref 38–126)
Anion gap: 11 (ref 5–15)
BUN: 9 mg/dL (ref 6–20)
CO2: 27 mmol/L (ref 22–32)
Calcium: 9.8 mg/dL (ref 8.9–10.3)
Chloride: 98 mmol/L (ref 98–111)
Creatinine, Ser: 0.98 mg/dL (ref 0.61–1.24)
GFR, Estimated: >60 mL/min
Glucose, Bld: 164 mg/dL — ABNORMAL HIGH (ref 70–99)
Potassium: 4.6 mmol/L (ref 3.5–5.1)
Sodium: 137 mmol/L (ref 135–145)
Total Bilirubin: 1.8 mg/dL — ABNORMAL HIGH (ref 0.0–1.2)
Total Protein: 7.9 g/dL (ref 6.5–8.1)

## 2024-09-03 LAB — CBC WITH DIFFERENTIAL/PLATELET
Abs Immature Granulocytes: 0.04 K/uL (ref 0.00–0.07)
Basophils Absolute: 0 K/uL (ref 0.0–0.1)
Basophils Relative: 0 %
Eosinophils Absolute: 0.1 K/uL (ref 0.0–0.5)
Eosinophils Relative: 1 %
HCT: 50.9 % (ref 39.0–52.0)
Hemoglobin: 17.8 g/dL — ABNORMAL HIGH (ref 13.0–17.0)
Immature Granulocytes: 0 %
Lymphocytes Relative: 14 %
Lymphs Abs: 1.4 K/uL (ref 0.7–4.0)
MCH: 31.7 pg (ref 26.0–34.0)
MCHC: 35 g/dL (ref 30.0–36.0)
MCV: 90.6 fL (ref 80.0–100.0)
Monocytes Absolute: 0.9 K/uL (ref 0.1–1.0)
Monocytes Relative: 10 %
Neutro Abs: 7.1 K/uL (ref 1.7–7.7)
Neutrophils Relative %: 75 %
Platelets: 186 K/uL (ref 150–400)
RBC: 5.62 MIL/uL (ref 4.22–5.81)
RDW: 12.1 % (ref 11.5–15.5)
WBC: 9.5 K/uL (ref 4.0–10.5)
nRBC: 0 % (ref 0.0–0.2)

## 2024-09-03 LAB — ETHANOL: Alcohol, Ethyl (B): <15 mg/dL

## 2024-09-03 MED ORDER — KETOROLAC TROMETHAMINE 15 MG/ML IJ SOLN
15.0000 mg | Freq: Once | INTRAMUSCULAR | Status: AC
  Administered 2024-09-03: 15 mg via INTRAVENOUS
  Filled 2024-09-03: qty 1

## 2024-09-03 MED ORDER — DEXAMETHASONE SOD PHOSPHATE PF 10 MG/ML IJ SOLN
10.0000 mg | Freq: Once | INTRAMUSCULAR | Status: AC
  Administered 2024-09-03: 10 mg via INTRAVENOUS
  Filled 2024-09-03: qty 1

## 2024-09-03 MED ORDER — DICLOFENAC EPOLAMINE 1.3 % EX PTCH
1.0000 | MEDICATED_PATCH | Freq: Two times a day (BID) | CUTANEOUS | Status: DC
  Administered 2024-09-03: 1 via TRANSDERMAL
  Filled 2024-09-03 (×3): qty 1

## 2024-09-03 MED ORDER — SALONPAS 3.1-6-10 % EX PTCH: 1.0000 | MEDICATED_PATCH | Freq: Two times a day (BID) | CUTANEOUS | 0 refills | Status: AC

## 2024-09-03 MED ORDER — PREDNISONE 20 MG PO TABS: 40.0000 mg | ORAL_TABLET | Freq: Every day | ORAL | 0 refills | Status: AC

## 2024-09-03 NOTE — ED Notes (Signed)
 Pt stood to have the patch placed on back. Steady while ambulating.

## 2024-09-03 NOTE — ED Notes (Signed)
 Pt ambulated from stretcher to bathroom. Pt was steady. Back in stretcher.

## 2024-09-03 NOTE — ED Provider Notes (Signed)
 " [facility] Provider Note   CSN: [id number] Arrival date & time: 09/03/24  9140     Patient presents with: Back Pain   Louis Gibson is a 59 y.o. male.   HPI Patient presents with low back pain that radiates down his left leg. He has a history of low back issues, has had multiple injections in the past.  After a fall that occurred last week he has had pain in the low back, rating down the left leg without abdominal pain, incontinence, ongoing chest pain. Fall occurred in the context of questionable syncope, possible alcohol use that night, she has no current chest pain or head pain, no focal weakness in the extremities. He notes that he fell from the bathtub required EMS assistance to extricate himself from it.     Prior to Admission medications  Medication Sig Start Date End Date Taking? Authorizing Provider  Camphor-Menthol-Methyl Sal (SALONPAS ) 3.08-26-08 % PTCH Apply 1 patch topically in the morning and at bedtime. 09/03/24  Yes Garrick Charleston, MD  predniSONE  (DELTASONE ) 20 MG tablet Take 2 tablets (40 mg total) by mouth daily with breakfast. For the next four days 09/03/24  Yes Garrick Charleston, MD  amLODipine  (NORVASC ) 10 MG tablet Take 10 mg by mouth daily. 05/28/21   [provider]  aspirin  EC 81 MG tablet Take 1 tablet (81 mg total) by mouth daily. Swallow whole. 07/14/24   Segal, Jared E, DO  cholecalciferol (VITAMIN D3) 25 MCG (1000 UNIT) tablet Take 1,000 Units by mouth daily.    [provider]  CINNAMON PO Take 1,000 mg by mouth daily.    [provider]  folic acid  (FOLVITE ) 800 MCG tablet Take 800 mcg by mouth daily.    [provider]  glimepiride (AMARYL) 4 MG tablet Take 4 mg by mouth daily. 11/21/20   [provider]  hydrochlorothiazide  (HYDRODIURIL ) 12.5 MG tablet Take 12.5 mg by mouth daily. 11/09/20   [provider]  JANUVIA 100 MG tablet Take 100 mg by mouth  daily. 11/21/20   [provider]  JARDIANCE 10 MG TABS tablet Take 10 mg by mouth daily. 04/18/24   [provider]  MAGNESIUM PO Take by mouth.    [provider]  metoprolol  tartrate (LOPRESSOR ) 50 MG tablet Take 1 tablet (50 mg total) by mouth once for 1 dose. Take 90-120 minutes prior to scan. Hold for SBP less than 110. 07/14/24 07/14/24  Segal, Jared E, DO  pantoprazole  (PROTONIX ) 20 MG tablet Take 1 tablet (20 mg total) by mouth daily. 03/26/24   Nivia Colon, PA-C  rosuvastatin  (CRESTOR ) 10 MG tablet Take 1 tablet (10 mg total) by mouth daily. 07/28/24   Segal, Jared E, DO  sucralfate  (CARAFATE ) 1 g tablet Take 1 tablet (1 g total) by mouth 4 (four) times daily -  with meals and at bedtime. 03/26/24   Nivia Colon, PA-C  thiamine  (VITAMIN B-1) 100 MG tablet Take 100 mg by mouth daily.    [provider]  tiZANidine (ZANAFLEX) 4 MG tablet Take 4 mg by mouth 2 (two) times daily as needed for muscle spasms. Patient not taking: Reported on 07/14/2024 09/03/23   [provider]  TURMERIC EXTRA STRENGTH PO Take 1,000 mg by mouth daily.    [provider]    Allergies: Egg protein-containing drug products    Review of Systems  Updated Vital Signs BP 134/82 (BP Location: Left Arm)   Pulse 83  Temp 98.4 F (36.9 C) (Oral)   Resp 16   SpO2 95%   Physical Exam Vitals and nursing note reviewed.  Constitutional:      General: He is not in acute distress.    Appearance: He is well-developed.  HENT:     Head: Normocephalic and atraumatic.  Eyes:     Conjunctiva/sclera: Conjunctivae normal.  Cardiovascular:     Rate and Rhythm: Normal rate and regular rhythm.  Pulmonary:     Effort: Pulmonary effort is normal. No respiratory distress.     Breath sounds: No stridor.  Abdominal:     General: There is no distension.  Musculoskeletal:     Comments: No deformities, some referred pain in the lower back right greater than left with hip flexion.   Skin:    General: Skin is warm and dry.  Neurological:     Mental Status: He is alert and oriented to person, place, and time.     (all labs ordered are listed, but only abnormal results are displayed) Labs Reviewed  CBC WITH DIFFERENTIAL/PLATELET - Abnormal; Notable for the following components:      Result Value   Hemoglobin 17.8 (*)    All other components within normal limits  COMPREHENSIVE METABOLIC PANEL WITH GFR - Abnormal; Notable for the following components:   Glucose, Bld 164 (*)    AST 48 (*)    ALT 48 (*)    Total Bilirubin 1.8 (*)    All other components within normal limits  ETHANOL    EKG: EKG Interpretation Date/Time:  Wednesday September 03 2024 09:48:21 EST Ventricular Rate:  98 PR Interval:  168 QRS Duration:  82 QT Interval:  320 QTC Calculation: 408 R Axis:   75  Text Interpretation: Normal sinus rhythm Septal infarct , age undetermined T wave abnormality Abnormal ECG Confirmed by Garrick Charleston [phone]) on 09/03/2024 10:16:40 AM  Radiology: [facility] Lumbar Spine Complete Result Date: 09/03/2024 CLINICAL DATA:  Syncope, fall, lumbar pain. EXAM: LUMBAR SPINE - COMPLETE 4+ VIEW COMPARISON:  09/18/2014. FINDINGS: Alignment is anatomic. Vertebral body and disc space heights are maintained. Multilevel endplate degenerative changes with anterior marginal osteophytosis and facet hypertrophy. Loss of disc space height at L4-5 and L5-S1. Findings are progressive from 09/18/2014. No definite pars defects. IMPRESSION: Multilevel degenerative disc disease and facet hypertrophy, worst at L4-5 and L5-S1, progressive from 09/18/2014. Electronically Signed   By: Newell Eke M.D.   On: 09/03/2024 11:58   DG Chest 2 View Result Date: 09/03/2024 CLINICAL DATA:  Syncope, fall, back pain. EXAM: CHEST - 2 VIEW COMPARISON:  03/26/2024. FINDINGS: Trachea is midline. Heart is at the upper limits of normal in size to mildly enlarged. Lungs are clear. No pleural fluid. Degenerative  changes in the spine. IMPRESSION: No acute findings. Electronically Signed   By: Newell Eke M.D.   On: 09/03/2024 11:57     Procedures   Medications Ordered in the ED  diclofenac  (FLECTOR ) 1.3 % 1 patch (1 patch Transdermal Patch Applied 09/03/24 1209)  ketorolac  (TORADOL ) 15 MG/ML injection 15 mg (15 mg Intravenous Given 09/03/24 1020)  dexamethasone  (DECADRON ) injection 10 mg (10 mg Intravenous Given 09/03/24 1023)                                    Medical Decision Making Adult male presents in the context of recent syncope, ongoing back pain, left leg discomfort.  Multiple considerations  including syncope, electrolyte disturbance, arrhythmia as well as low back issues including lumbosacral radiculopathy versus trauma.  Amount and/or Complexity of Data Reviewed External Data Reviewed: notes. Labs: ordered. Decision-making details documented in ED Course. Radiology: ordered and independent interpretation performed. Decision-making details documented in ED Course. ECG/medicine tests: ordered and independent interpretation performed. Decision-making details documented in ED Course.  Risk Prescription drug management. Decision regarding hospitalization. Diagnosis or treatment significantly limited by social determinants of health.   1:27 PM Patient in no distress, awake, alert, speaking clearly.  We discussed all findings and I reviewed his x-rays, labs. Findings generally reassuring, no evidence for acute life-threatening condition, some suspicion for worsening lumbosacral radiculopathy contributing to his pain.  Absent evidence for bacteremia, sepsis, infection, patient will continue steroids, anti-inflammatories.     Final diagnoses:  Fall, initial encounter  Acute left-sided low back pain with left-sided sciatica    ED Discharge Orders          Ordered    predniSONE  (DELTASONE ) 20 MG tablet  Daily with breakfast        09/03/24 1326    Camphor-Menthol-Methyl Sal  (SALONPAS ) 3.08-26-08 % PTCH  2 times daily        09/03/24 1326               Garrick Charleston, MD 09/03/24 1327  "

## 2024-09-03 NOTE — Discharge Instructions (Signed)
Follow-up with your physician or return here for concerning changes in your condition.

## 2024-09-03 NOTE — ED Triage Notes (Addendum)
 Pt reports low back pain, shooting does L leg; reports difficulty walking; symptoms ongoing since fall last Thursday, pain worse today; reports hx sciatica; took tylenol  this am without relief  Pt gives verbal consent for mse

## 2024-09-11 ENCOUNTER — Telehealth: Payer: Self-pay | Admitting: Internal Medicine

## 2024-09-11 DIAGNOSIS — R079 Chest pain, unspecified: Secondary | ICD-10-CM

## 2024-09-11 NOTE — Telephone Encounter (Signed)
" °*  STAT* If patient is at the pharmacy, call can be transferred to refill team.   1. Which medications need to be refilled? (please list name of each medication and dose if known) rosuvastatin  (CRESTOR ) 10 MG tablet   2. Which pharmacy/location (including street and city if local pharmacy) is medication to be sent to? [id number] - [place], [place] - [address]   3. Do they need a 30 day or 90 day supply? 90  "

## 2024-09-16 MED ORDER — ROSUVASTATIN CALCIUM 10 MG PO TABS: 10.0000 mg | ORAL_TABLET | Freq: Every day | ORAL | 3 refills | Status: AC
